# Patient Record
Sex: Female | Born: 1985
Health system: Southern US, Community
[De-identification: ages and names within clinical notes are randomized; demographics above are authoritative.]

## PROBLEM LIST (undated history)

## (undated) ENCOUNTER — Inpatient Hospital Stay (HOSPITAL_COMMUNITY): Payer: Self-pay

## (undated) DIAGNOSIS — R002 Palpitations: Secondary | ICD-10-CM

## (undated) DIAGNOSIS — R519 Headache, unspecified: Secondary | ICD-10-CM

## (undated) DIAGNOSIS — R42 Dizziness and giddiness: Secondary | ICD-10-CM

## (undated) DIAGNOSIS — T8859XA Other complications of anesthesia, initial encounter: Secondary | ICD-10-CM

## (undated) DIAGNOSIS — N809 Endometriosis, unspecified: Secondary | ICD-10-CM

## (undated) DIAGNOSIS — R51 Headache: Secondary | ICD-10-CM

## (undated) DIAGNOSIS — T4145XA Adverse effect of unspecified anesthetic, initial encounter: Secondary | ICD-10-CM

## (undated) DIAGNOSIS — D649 Anemia, unspecified: Secondary | ICD-10-CM

## (undated) HISTORY — PX: APPENDECTOMY: SHX54

## (undated) HISTORY — DX: Dizziness and giddiness: R42

---
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
## 2007-09-16 ENCOUNTER — Emergency Department (HOSPITAL_COMMUNITY): Admission: EM | Admit: 2007-09-16 | Discharge: 2007-09-16 | Payer: Self-pay | Admitting: Emergency Medicine

## 2011-08-30 ENCOUNTER — Emergency Department (HOSPITAL_COMMUNITY)
Admission: EM | Admit: 2011-08-30 | Discharge: 2011-08-30 | Disposition: A | Discharge status: Home / Self Care | Payer: 59 | Attending: Emergency Medicine | Admitting: Emergency Medicine

## 2011-08-30 ENCOUNTER — Encounter: Payer: Self-pay | Admitting: *Deleted

## 2011-08-30 DIAGNOSIS — K5289 Other specified noninfective gastroenteritis and colitis: Secondary | ICD-10-CM

## 2011-08-30 DIAGNOSIS — R112 Nausea with vomiting, unspecified: Secondary | ICD-10-CM | POA: Insufficient documentation

## 2011-08-30 DIAGNOSIS — R197 Diarrhea, unspecified: Secondary | ICD-10-CM | POA: Insufficient documentation

## 2011-08-30 LAB — COMPREHENSIVE METABOLIC PANEL
ALT: 12 U/L (ref 0–35)
AST: 15 U/L (ref 0–37)
Albumin: 4.5 g/dL (ref 3.5–5.2)
Alkaline Phosphatase: 53 U/L (ref 39–117)
BUN: 10 mg/dL (ref 6–23)
CO2: 26 mEq/L (ref 19–32)
Calcium: 9.4 mg/dL (ref 8.4–10.5)
Chloride: 102 mEq/L (ref 96–112)
Creatinine, Ser: <0.47 mg/dL — ABNORMAL LOW (ref 0.50–1.10)
Glucose, Bld: 87 mg/dL (ref 70–99)
Potassium: 3.4 mEq/L — ABNORMAL LOW (ref 3.5–5.1)
Sodium: 140 mEq/L (ref 135–145)
Total Bilirubin: 1 mg/dL (ref 0.3–1.2)
Total Protein: 7.8 g/dL (ref 6.0–8.3)

## 2011-08-30 LAB — CBC
MCH: 30.8 pg (ref 26.0–34.0)
MCHC: 33.4 g/dL (ref 30.0–36.0)
Platelets: 283 10*3/uL (ref 150–400)
RBC: 4.35 MIL/uL (ref 3.87–5.11)
RDW: 12.8 % (ref 11.5–15.5)

## 2011-08-30 LAB — URINALYSIS, ROUTINE W REFLEX MICROSCOPIC
Ketones, ur: 40 mg/dL — AB
Leukocytes, UA: NEGATIVE
Nitrite: NEGATIVE
Protein, ur: NEGATIVE mg/dL
pH: 6.5 (ref 5.0–8.0)

## 2011-08-30 LAB — URINE MICROSCOPIC-ADD ON

## 2011-08-30 LAB — LIPASE, BLOOD: Lipase: 31 U/L (ref 11–59)

## 2011-08-30 LAB — DIFFERENTIAL
Basophils Absolute: 0 10*3/uL (ref 0.0–0.1)
Basophils Relative: 0 % (ref 0–1)
Eosinophils Absolute: 0 10*3/uL (ref 0.0–0.7)
Neutrophils Relative %: 89 % — ABNORMAL HIGH (ref 43–77)

## 2011-08-30 MED ORDER — ONDANSETRON 8 MG PO TBDP: 8.0000 mg | ORAL_TABLET | Freq: Three times a day (TID) | ORAL | Status: AC | PRN

## 2011-08-30 MED ORDER — SODIUM CHLORIDE 0.9 % IV BOLUS (SEPSIS)
1000.0000 mL | Freq: Once | INTRAVENOUS | Status: AC
  Administered 2011-08-30: 1000 mL via INTRAVENOUS

## 2011-08-30 MED ORDER — ONDANSETRON HCL 4 MG/2ML IJ SOLN
4.0000 mg | Freq: Once | INTRAMUSCULAR | Status: DC
  Filled 2011-08-30: qty 2

## 2011-08-30 MED ORDER — ONDANSETRON HCL 4 MG/2ML IJ SOLN
4.0000 mg | Freq: Once | INTRAMUSCULAR | Status: AC
  Administered 2011-08-30: 4 mg via INTRAVENOUS
  Filled 2011-08-30: qty 2

## 2011-08-30 MED ORDER — METOCLOPRAMIDE HCL 5 MG/ML IJ SOLN
10.0000 mg | Freq: Once | INTRAMUSCULAR | Status: AC
  Administered 2011-08-30: 10 mg via INTRAVENOUS
  Filled 2011-08-30: qty 2

## 2011-08-30 NOTE — ED Provider Notes (Addendum)
History     CSN: [id number] Arrival date & time: 08/30/2011 11:03 AM   Chief Complaint  Patient presents with  . Abdominal Pain     (Include location/radiation/quality/duration/timing/severity/associated sxs/prior treatment) Patient is a 25 y.o. female presenting with vomiting. The history is provided by the patient and a relative.  Emesis  This is a new problem. The current episode started yesterday. The problem occurs 5 to 10 times per day. The problem has not changed since onset.The emesis has an appearance of stomach contents. There has been no fever. Associated symptoms include diarrhea. Pertinent negatives include no abdominal pain, no arthralgias, no chills, no fever and no headaches. Associated symptoms comments: Diarrhea x 2 early this am.  Abdominal cramping, improves with emesis or diarrhea.  . Risk factors include suspect food intake (she states symptoms started yesterday afternoon,  after eating an omelet for breakfast, which she has never had before.).     History reviewed. No pertinent past medical history.   Past Surgical History  Procedure Date  . Appendectomy     History reviewed. No pertinent family history.  History  Substance Use Topics  . Smoking status: Never Smoker   . Smokeless tobacco: Not on file  . Alcohol Use:     OB History    Grav Para Term Preterm Abortions TAB SAB Ect Mult Living                  Review of Systems  Constitutional: Negative for fever and chills.  HENT: Negative for congestion, sore throat and neck pain.   Eyes: Negative.   Respiratory: Negative for chest tightness and shortness of breath.   Cardiovascular: Negative for chest pain.  Gastrointestinal: Positive for vomiting and diarrhea. Negative for nausea, abdominal pain, constipation and abdominal distention.  Genitourinary: Negative.   Musculoskeletal: Negative for joint swelling and arthralgias.  Skin: Negative.  Negative for rash and wound.  Neurological: Negative  for dizziness, weakness, light-headedness, numbness and headaches.  Hematological: Negative.   Psychiatric/Behavioral: Negative.     Allergies  Ceclor and Phenergan  Home Medications   Current Outpatient Rx  Name Route Sig Dispense Refill  . BISMUTH SUBSALICYLATE 262 MG PO TABS Oral Take 2 tablets by mouth as needed.      Marland Kitchen MECLIZINE HCL 25 MG PO TABS Oral Take 25 mg by mouth daily as needed. For nausea        Physical Exam    BP 104/64  Pulse 70  Temp(Src) 98.9 F (37.2 C) (Oral)  Resp 19  Ht 5\' 7"  (1.702 m)  Wt 100 lb (45.36 kg)  BMI 15.66 kg/m2  SpO2 98%  Physical Exam  Nursing note and vitals reviewed. Constitutional: She is oriented to person, place, and time. She appears well-developed and well-nourished.  HENT:  Head: Normocephalic and atraumatic.  Eyes: Conjunctivae are normal.  Neck: Normal range of motion.  Cardiovascular: Normal rate, regular rhythm, normal heart sounds and intact distal pulses.   Pulmonary/Chest: Effort normal and breath sounds normal. She has no wheezes.  Abdominal: Soft. She exhibits no distension and no mass. Bowel sounds are increased. There is no tenderness. There is no rebound and no guarding.  Musculoskeletal: Normal range of motion.  Neurological: She is alert and oriented to person, place, and time.  Skin: Skin is warm and dry.  Psychiatric: She has a normal mood and affect.    ED Course  Procedures  Results for orders placed during the hospital encounter of 08/30/11  COMPREHENSIVE  METABOLIC PANEL      Component Value Range   Sodium 140  135 - 145 (mEq/L)   Potassium 3.4 (*) 3.5 - 5.1 (mEq/L)   Chloride 102  96 - 112 (mEq/L)   CO2 26  19 - 32 (mEq/L)   Glucose, Bld 87  70 - 99 (mg/dL)   BUN 10  6 - 23 (mg/dL)   Creatinine, Ser <1.30 (*) 0.50 - 1.10 (mg/dL)   Calcium 9.4  8.4 - 86.5 (mg/dL)   Total Protein 7.8  6.0 - 8.3 (g/dL)   Albumin 4.5  3.5 - 5.2 (g/dL)   AST 15  0 - 37 (U/L)   ALT 12  0 - 35 (U/L)   Alkaline  Phosphatase 53  39 - 117 (U/L)   Total Bilirubin 1.0  0.3 - 1.2 (mg/dL)   GFR calc non Af Amer NOT CALCULATED  >60 (mL/min)   GFR calc Af Amer NOT CALCULATED  >60 (mL/min)  LIPASE, BLOOD      Component Value Range   Lipase 31  11 - 59 (U/L)  URINALYSIS, ROUTINE W REFLEX MICROSCOPIC      Component Value Range   Color, Urine YELLOW  YELLOW    Appearance CLEAR  CLEAR    Specific Gravity, Urine 1.025  1.005 - 1.030    pH 6.5  5.0 - 8.0    Glucose, UA NEGATIVE  NEGATIVE (mg/dL)   Hgb urine dipstick TRACE (*) NEGATIVE    Bilirubin Urine NEGATIVE  NEGATIVE    Ketones, ur 40 (*) NEGATIVE (mg/dL)   Protein, ur NEGATIVE  NEGATIVE (mg/dL)   Urobilinogen, UA 0.2  0.0 - 1.0 (mg/dL)   Nitrite NEGATIVE  NEGATIVE    Leukocytes, UA NEGATIVE  NEGATIVE   PREGNANCY, URINE      Component Value Range   Preg Test, Ur NEGATIVE    CBC      Component Value Range   WBC 7.4  4.0 - 10.5 (K/uL)   RBC 4.35  3.87 - 5.11 (MIL/uL)   Hemoglobin 13.4  12.0 - 15.0 (g/dL)   HCT 78.4  69.6 - 29.5 (%)   MCV 92.2  78.0 - 100.0 (fL)   MCH 30.8  26.0 - 34.0 (pg)   MCHC 33.4  30.0 - 36.0 (g/dL)   RDW 28.4  13.2 - 44.0 (%)   Platelets 283  150 - 400 (K/uL)  DIFFERENTIAL      Component Value Range   Neutrophils Relative 89 (*) 43 - 77 (%)   Neutro Abs 6.6  1.7 - 7.7 (K/uL)   Lymphocytes Relative 6 (*) 12 - 46 (%)   Lymphs Abs 0.4 (*) 0.7 - 4.0 (K/uL)   Monocytes Relative 5  3 - 12 (%)   Monocytes Absolute 0.4  0.1 - 1.0 (K/uL)   Eosinophils Relative 0  0 - 5 (%)   Eosinophils Absolute 0.0  0.0 - 0.7 (K/uL)   Basophils Relative 0  0 - 1 (%)   Basophils Absolute 0.0  0.0 - 0.1 (K/uL)  URINE MICROSCOPIC-ADD ON      Component Value Range   Squamous Epithelial / LPF FEW (*) RARE    WBC, UA 0-2  <3 (WBC/hpf)   RBC / HPF 0-2  <3 (RBC/hpf)     Patient with continued nausea, although better after receiving zofran.  15:50 - reglan ordered.  Patient has received NS 2 liters.  Denies lightheadedness with standing for  orthostatic VS.     MDM Suspect viral  gastroenteritis vs food borne infection.   Zofran odt prescribed,  B.r.a.t. Diet,  Rest, fluids.         Candis Musa, PA 08/30/11 1556  Candis Musa, PA 08/30/11 1650

## 2011-08-30 NOTE — ED Provider Notes (Signed)
Medical screening examination/treatment/procedure(s) were performed by non-physician practitioner and as supervising physician I was immediately available for consultation/collaboration.  Nicholes Stairs, MD 08/30/11 [phone]

## 2011-08-30 NOTE — ED Notes (Signed)
Abdominal pain, nausea, vomiting and diarrhea

## 2011-08-30 NOTE — ED Notes (Signed)
Patient states that her heart rate is usually really fast. When going to the sitting position patient felt dizzy but after sitting there for a minute patient felt back to normal.

## 2011-08-30 NOTE — ED Notes (Signed)
Pt reports n/v abd pain since last night.  Pt reports "i feel like i ate something bad".  Pt reports that the pain is generalized.  Pt reports hx of appendectomy.

## 2011-08-30 NOTE — ED Notes (Signed)
Pt c/o severe nausea, gave Zofran per protocol.

## 2011-08-31 NOTE — ED Provider Notes (Signed)
Medical screening examination/treatment/procedure(s) were performed by non-physician practitioner and as supervising physician I was immediately available for consultation/collaboration.  Adama Ferber P Seaira Byus, MD 08/31/11 2043 

## 2011-12-02 ENCOUNTER — Ambulatory Visit (INDEPENDENT_AMBULATORY_CARE_PROVIDER_SITE_OTHER): Payer: 59 | Admitting: Gynecology

## 2011-12-02 ENCOUNTER — Encounter: Payer: Self-pay | Admitting: Gynecology

## 2011-12-02 ENCOUNTER — Other Ambulatory Visit (HOSPITAL_COMMUNITY)
Admission: RE | Admit: 2011-12-02 | Discharge: 2011-12-02 | Disposition: A | Discharge status: Home / Self Care | Payer: 59 | Source: Ambulatory Visit | Attending: Gynecology | Admitting: Gynecology

## 2011-12-02 VITALS — BP 96/62 | Ht 67.0 in | Wt 100.0 lb

## 2011-12-02 DIAGNOSIS — R519 Headache, unspecified: Secondary | ICD-10-CM | POA: Insufficient documentation

## 2011-12-02 DIAGNOSIS — R51 Headache: Secondary | ICD-10-CM | POA: Insufficient documentation

## 2011-12-02 DIAGNOSIS — Z13220 Encounter for screening for lipoid disorders: Secondary | ICD-10-CM

## 2011-12-02 DIAGNOSIS — Z01419 Encounter for gynecological examination (general) (routine) without abnormal findings: Secondary | ICD-10-CM | POA: Insufficient documentation

## 2011-12-02 DIAGNOSIS — Z131 Encounter for screening for diabetes mellitus: Secondary | ICD-10-CM

## 2011-12-02 MED ORDER — NORETHINDRONE ACET-ETHINYL EST 1-20 MG-MCG PO TABS: 1.0000 | ORAL_TABLET | Freq: Every day | ORAL | Status: DC

## 2011-12-02 NOTE — Progress Notes (Signed)
Maria Walton 04-08-1986 [id number]        25 y.o.  for first GYN exam.  Getting married this coming year. Menses are regular once a month little on the heavier side with cramping.  Past medical history,surgical history, medications, allergies, family history and social history were all reviewed and documented in the EPIC chart. ROS:  Was performed and pertinent positives and negatives are included in the history.  Exam: chaperone present Filed Vitals:   12/02/11 1002  BP: 96/62   General appearance  Normal Skin grossly normal Head/Neck normal with no cervical or supraclavicular adenopathy thyroid normal Lungs  clear Cardiac RR, without RMG Abdominal  soft, nontender, without masses, organomegaly or hernia Breasts  examined lying and sitting without masses, retractions, discharge or axillary adenopathy. Pelvic  Ext/BUS/vagina  normal virginal  Cervix  normal  Pap done  Uterus  retroverted, normal size, shape and contour, midline and mobile nontender   Adnexa  Without masses or tenderness    Anus and perineum  normal     Assessment/Plan:  25 y.o. female for first exam.    1. Contraception. Patient wants started on contraception in anticipation of her marriage. She is virginal now. I discussed options to include pill, patch, ring, Depo-Provera and Implanon, IUD. She wants to try oral contraceptives. I discussed the risks of stroke, heart attack, DVT. I prescribed Loestrin 120 equivalent. Every other month withdrawal option discussed, off brand labeling reviewed. 2. Pap smear. Pap smear was done today as her first Pap smear. 3. Breast health. SBE monthly reviewed. 4. Gardasil. I reviewed Gardasil series with her, gave her literature, recommended she consider the series and she'll decide. 5. Health maintenance. Will check baseline CBC, lipid profile, glucose, urinalysis. Assuming she does well with the pills, she will see me in a year, sooner as needed.    Dara Lords MD,  10:32 AM 12/02/2011

## 2011-12-02 NOTE — Patient Instructions (Addendum)
Start birth control pills as discussed. Assuming you do well then follow up in one year, sooner if any issues.  Consider Gardasil vaccine series as we discussed.

## 2012-02-21 ENCOUNTER — Telehealth: Payer: Self-pay | Admitting: *Deleted

## 2012-02-21 NOTE — Telephone Encounter (Signed)
Lets try one more 2 month cycle.  If it continues call and I will switch pills.

## 2012-02-21 NOTE — Telephone Encounter (Signed)
Pt informed with the below note. 

## 2012-02-21 NOTE — Telephone Encounter (Signed)
(  pt aware you are out of the office) Pt saw you back in December 2012 and started on microgestin birth control, pt didn't start pill until Jan and has been taking them as directed by you every other month withdrawal option. Pt has been having some dizzy spells, light-headed at times and also having a breakthrough bleeding for about 2 months now. Bleeding is not heavy, but not spotting either, mostly mild flow that would last for about 3-4 days. She said that bleeding is not as heavy as before prior to starting pills. Please advise

## 2012-05-22 ENCOUNTER — Emergency Department (HOSPITAL_COMMUNITY): Payer: 59

## 2012-05-22 ENCOUNTER — Encounter (HOSPITAL_COMMUNITY): Payer: Self-pay | Admitting: Emergency Medicine

## 2012-05-22 ENCOUNTER — Inpatient Hospital Stay (HOSPITAL_COMMUNITY)
Admission: EM | Admit: 2012-05-22 | Discharge: 2012-05-22 | DRG: 446 | Disposition: A | Discharge status: Home / Self Care | Payer: 59 | Attending: General Surgery | Admitting: General Surgery

## 2012-05-22 DIAGNOSIS — K8000 Calculus of gallbladder with acute cholecystitis without obstruction: Principal | ICD-10-CM | POA: Diagnosis present

## 2012-05-22 DIAGNOSIS — K8050 Calculus of bile duct without cholangitis or cholecystitis without obstruction: Secondary | ICD-10-CM

## 2012-05-22 LAB — DIFFERENTIAL
Basophils Absolute: 0 10*3/uL (ref 0.0–0.1)
Eosinophils Absolute: 0.1 10*3/uL (ref 0.0–0.7)
Eosinophils Relative: 1 % (ref 0–5)
Lymphs Abs: 1.8 10*3/uL (ref 0.7–4.0)
Monocytes Absolute: 0.5 10*3/uL (ref 0.1–1.0)

## 2012-05-22 LAB — CBC
MCH: 31.5 pg (ref 26.0–34.0)
MCV: 93.8 fL (ref 78.0–100.0)
Platelets: 273 10*3/uL (ref 150–400)
RDW: 12.9 % (ref 11.5–15.5)

## 2012-05-22 LAB — COMPREHENSIVE METABOLIC PANEL
ALT: 8 U/L (ref 0–35)
Calcium: 9.5 mg/dL (ref 8.4–10.5)
Creatinine, Ser: 0.74 mg/dL (ref 0.50–1.10)
GFR calc Af Amer: >90 mL/min (ref >90)
Glucose, Bld: 110 mg/dL — ABNORMAL HIGH (ref 70–99)
Sodium: 135 mEq/L (ref 135–145)
Total Protein: 8.1 g/dL (ref 6.0–8.3)

## 2012-05-22 LAB — PREGNANCY, URINE: Preg Test, Ur: NEGATIVE

## 2012-05-22 MED ORDER — SODIUM CHLORIDE 0.9 % IV SOLN: INTRAVENOUS | Status: DC

## 2012-05-22 MED ORDER — ONDANSETRON HCL 4 MG/2ML IJ SOLN: 4.0000 mg | Freq: Three times a day (TID) | INTRAMUSCULAR | Status: DC | PRN

## 2012-05-22 MED ORDER — HYDROMORPHONE HCL PF 1 MG/ML IJ SOLN: 1.0000 mg | INTRAMUSCULAR | Status: DC | PRN

## 2012-05-22 MED ORDER — HYDROMORPHONE HCL PF 1 MG/ML IJ SOLN
1.0000 mg | Freq: Once | INTRAMUSCULAR | Status: AC
  Administered 2012-05-22: 1 mg via INTRAVENOUS
  Filled 2012-05-22: qty 1

## 2012-05-22 MED ORDER — SODIUM CHLORIDE 0.9 % IV BOLUS (SEPSIS)
1000.0000 mL | Freq: Once | INTRAVENOUS | Status: AC
  Administered 2012-05-22: 1000 mL via INTRAVENOUS

## 2012-05-22 MED ORDER — ONDANSETRON HCL 4 MG/2ML IJ SOLN
4.0000 mg | Freq: Once | INTRAMUSCULAR | Status: AC
  Administered 2012-05-22: 4 mg via INTRAVENOUS
  Filled 2012-05-22: qty 2

## 2012-05-22 MED ORDER — IOHEXOL 300 MG/ML  SOLN
100.0000 mL | Freq: Once | INTRAMUSCULAR | Status: AC | PRN
  Administered 2012-05-22: 100 mL via INTRAVENOUS

## 2012-05-22 MED ORDER — FAMOTIDINE IN NACL 20-0.9 MG/50ML-% IV SOLN
20.0000 mg | Freq: Once | INTRAVENOUS | Status: AC
  Administered 2012-05-22: 20 mg via INTRAVENOUS
  Filled 2012-05-22: qty 50

## 2012-05-22 NOTE — ED Provider Notes (Addendum)
History     CSN: [id number]  Arrival date & time 05/22/12  3086   First MD Initiated Contact with Patient 05/22/12 0259      Chief Complaint  Patient presents with  . Abdominal Pain    (Consider location/radiation/quality/duration/timing/severity/associated sxs/prior treatment) Patient is a 26 y.o. female presenting with abdominal pain. The history is provided by the patient.  Abdominal Pain The primary symptoms of the illness include abdominal pain, nausea and vomiting. The primary symptoms of the illness do not include fever, shortness of breath, diarrhea, hematemesis or dysuria. The current episode started more than 2 days ago (onset discomfort about a week ago but got worse tonight at around midnight). The onset of the illness was sudden. The problem has been rapidly worsening.  The abdominal pain is located in the epigastric region (but worse in the upper quadrants.). The abdominal pain radiates to the back. The severity of the abdominal pain is 10/10. The abdominal pain is relieved by nothing.  Additional symptoms associated with the illness include back pain. Symptoms associated with the illness do not include diaphoresis.   Patient with similar pain in the past but would usually resolve her mother thought that maybe she had a gallstone problem. No family history of gallstones.  Past Medical History  Diagnosis Date  . Headache     Past Surgical History  Procedure Date  . Appendectomy     Family History  Problem Relation Age of Onset  . Hypertension Father   . Diabetes Maternal Grandmother   . Heart disease Maternal Grandmother   . Hypertension Paternal Grandfather     History  Substance Use Topics  . Smoking status: Never Smoker   . Smokeless tobacco: Never Used  . Alcohol Use: No    OB History    Grav Para Term Preterm Abortions TAB SAB Ect Mult Living   0               Review of Systems  Constitutional: Negative for fever and diaphoresis.  HENT:  Negative for neck pain.   Eyes: Negative for visual disturbance.  Respiratory: Negative for shortness of breath.   Cardiovascular: Negative for chest pain.  Gastrointestinal: Positive for nausea, vomiting and abdominal pain. Negative for diarrhea and hematemesis.  Genitourinary: Negative for dysuria.  Musculoskeletal: Positive for back pain.  Skin: Negative for rash.  Neurological: Negative for headaches.  Hematological: Does not bruise/bleed easily.    Allergies  Ceclor and Promethazine hcl  Home Medications   Current Outpatient Rx  Name Route Sig Dispense Refill  . ACETAMINOPHEN 325 MG PO TABS Oral Take 650 mg by mouth every 6 (six) hours as needed.      Marland Kitchen BISMUTH SUBSALICYLATE 262 MG PO TABS Oral Take 2 tablets by mouth as needed.      . IBUPROFEN 200 MG PO TABS Oral Take 200 mg by mouth every 6 (six) hours as needed. Cramps      . MECLIZINE HCL 25 MG PO TABS Oral Take 25 mg by mouth daily as needed. For nausea      . NORETHINDRONE ACET-ETHINYL EST 1-20 MG-MCG PO TABS Oral Take 1 tablet by mouth daily. 1 Package 11    BP 106/66  Pulse 84  Temp(Src) 97.5 F (36.4 C) (Oral)  Resp 20  Ht 5\' 7"  (1.702 m)  Wt 100 lb (45.36 kg)  BMI 15.66 kg/m2  SpO2 99%  LMP 04/29/2012  Physical Exam  Nursing note and vitals reviewed. Constitutional: She is oriented  to person, place, and time. She appears well-developed and well-nourished. She appears distressed.  HENT:  Head: Normocephalic and atraumatic.  Mouth/Throat: Oropharynx is clear and moist.  Eyes: Conjunctivae and EOM are normal. Pupils are equal, round, and reactive to light.  Neck: Normal range of motion. Neck supple.  Cardiovascular: Normal rate, regular rhythm and normal heart sounds.   No murmur heard. Pulmonary/Chest: Effort normal and breath sounds normal. She has no wheezes.  Abdominal: Soft. Bowel sounds are normal. There is tenderness. There is no guarding.       Tenderness predominantly epigastric right upper  quadrant area. No guarding.  Musculoskeletal: Normal range of motion. She exhibits no edema.  Neurological: She is alert and oriented to person, place, and time. No cranial nerve deficit. She exhibits normal muscle tone. Coordination normal.  Skin: Skin is warm. No rash noted. She is not diaphoretic.    ED Course  Procedures (including critical care time)  Labs Reviewed  COMPREHENSIVE METABOLIC PANEL - Abnormal; Notable for the following:    Potassium 3.1 (*)    Glucose, Bld 110 (*)    All other components within normal limits  CBC  DIFFERENTIAL  LIPASE, BLOOD  PREGNANCY, URINE   Dg Chest 2 View  05/22/2012  *RADIOLOGY REPORT*  Clinical Data: Upper abdominal pain  CHEST - 2 VIEW  Comparison: None.  Findings: Lungs are clear.  Cardiomediastinal contours within normal limits.  Mild rightward curvature of the thoracic spine.  No acute osseous finding.  IMPRESSION: No radiographic evidence of acute cardiopulmonary process.  Original Report Authenticated By: Waneta Martins, M.D.   Ct Abdomen Pelvis W Contrast  05/22/2012  *RADIOLOGY REPORT*  Clinical Data: Abdominal pain  CT ABDOMEN AND PELVIS WITH CONTRAST  Technique:  Multidetector CT imaging of the abdomen and pelvis was performed following the standard protocol during bolus administration of intravenous contrast.  Contrast: OMNIPAQUE IOHEXOL 300 MG/ML  SOLN  Comparison: None.  Findings: Limited images through the lung bases demonstrate no significant appreciable abnormality. The heart size is within normal limits. No pleural or pericardial effusion.  Unremarkable liver, spleen, pancreas, adrenal glands.  The gallbladder is distended and contains a large gallstone.  There is mild gallbladder wall thickening.  No biliary ductal dilatation. Mild periportal edema.  Symmetric renal enhancement.  No hydronephrosis or hydroureter.  No bowel obstruction.  No CT evidence for colitis.  Appendix not identified.  No free intraperitoneal air.   Circumaortic left renal vein.  Normal caliber aorta and branch vessels.  Thin-walled bladder.  Small amount of free fluid within the pelvis. Right adnexal cyst is likely physiologic.  No acute osseous finding.  IMPRESSION:  Distended gallbladder with an intraluminal gallstone and gallbladder wall thickening.   May represent acute cholecystitis. Consider ultrasound or HIDA scan for confirmation.  Right adnexal cyst and small amount of free fluid within the pelvis is likely physiologic.  If there is concern for pelvic pathology, pelvic examination and ultrasound recommended.  The appendix is not identified and per report is surgically absent.  Original Report Authenticated By: Waneta Martins, M.D.   Results for orders placed during the hospital encounter of 05/22/12  CBC      Component Value Range   WBC 9.7  4.0 - 10.5 (K/uL)   RBC 4.38  3.87 - 5.11 (MIL/uL)   Hemoglobin 13.8  12.0 - 15.0 (g/dL)   HCT 16.1  09.6 - 04.5 (%)   MCV 93.8  78.0 - 100.0 (fL)   MCH  31.5  26.0 - 34.0 (pg)   MCHC 33.6  30.0 - 36.0 (g/dL)   RDW 47.8  29.5 - 62.1 (%)   Platelets 273  150 - 400 (K/uL)  DIFFERENTIAL      Component Value Range   Neutrophils Relative 76  43 - 77 (%)   Neutro Abs 7.4  1.7 - 7.7 (K/uL)   Lymphocytes Relative 19  12 - 46 (%)   Lymphs Abs 1.8  0.7 - 4.0 (K/uL)   Monocytes Relative 5  3 - 12 (%)   Monocytes Absolute 0.5  0.1 - 1.0 (K/uL)   Eosinophils Relative 1  0 - 5 (%)   Eosinophils Absolute 0.1  0.0 - 0.7 (K/uL)   Basophils Relative 0  0 - 1 (%)   Basophils Absolute 0.0  0.0 - 0.1 (K/uL)  COMPREHENSIVE METABOLIC PANEL      Component Value Range   Sodium 135  135 - 145 (mEq/L)   Potassium 3.1 (*) 3.5 - 5.1 (mEq/L)   Chloride 99  96 - 112 (mEq/L)   CO2 26  19 - 32 (mEq/L)   Glucose, Bld 110 (*) 70 - 99 (mg/dL)   BUN 9  6 - 23 (mg/dL)   Creatinine, Ser 3.08  0.50 - 1.10 (mg/dL)   Calcium 9.5  8.4 - 65.7 (mg/dL)   Total Protein 8.1  6.0 - 8.3 (g/dL)   Albumin 4.2  3.5 - 5.2  (g/dL)   AST 14  0 - 37 (U/L)   ALT 8  0 - 35 (U/L)   Alkaline Phosphatase 46  39 - 117 (U/L)   Total Bilirubin 0.6  0.3 - 1.2 (mg/dL)   GFR calc non Af Amer >90  >90 (mL/min)   GFR calc Af Amer >90  >90 (mL/min)  LIPASE, BLOOD      Component Value Range   Lipase 36  11 - 59 (U/L)  PREGNANCY, URINE      Component Value Range   Preg Test, Ur NEGATIVE  NEGATIVE      1. Biliary colic       MDM   Patient with some abdominal pain all week got worse tonight CT is consistent with cholelithiasis symptoms most likely prolonged biliary colic currently pain-free and nontender no leukocytosis no liver function test abnormalities lipase is normal no evidence of pancreatitis. Please see test is negative. Patient would prefer to be admitted and had her gallbladder removed sooner than later discussed with general surgery on call who will admit temporary middle orders. CT results is consistent with [place] in gallbladder distention and some gallbladder wall thickening however clinically and based on labs no evidence of significant acute cholecystitis at this time.  DW General Surgery Dr. Leticia Penna option given to patient to go home and close follow up for gallbladder removal or to be admitted now and have gallbladder removed later today or tomorrow. Patient opted for removal now. Patient most likely with prolonged biliary colic for past week and some signs of early cholecystitis but currently nontender and pain free.   Temporary admit orders completed to MedSurg.        Shelda Jakes, MD 05/22/12 8469  Shelda Jakes, MD 05/23/12 0400

## 2012-05-22 NOTE — ED Notes (Signed)
Received report, pt resting at present time, update given

## 2012-05-22 NOTE — Plan of Care (Signed)
Problem: Phase I Progression Outcomes Goal: Initial discharge plan identified Outcome: Completed/Met Date Met:  05/22/12 Pt d/c home verbalized understanding of d/c orders.

## 2012-05-22 NOTE — ED Notes (Signed)
Pt reporting cramping abdominal pain in upper quadrants.  Reports intermittent for past few days, however sharp and severe enough tonight to wake her.  Denies any nausea, vomiting or diarrhea.

## 2012-05-22 NOTE — ED Notes (Signed)
Pt comfortable at present time, family at bedside,

## 2012-05-22 NOTE — ED Notes (Signed)
Patient complaining of abdominal pain radiating into chest area x 1 week worsening tonight. Denies vomiting / diarrhea.

## 2012-05-22 NOTE — Discharge Summary (Signed)
Patient presented this morning to the emergency department with right upper quadrant abdominal pain. Apparently the pain has been going on for several weeks per the report by the emergency room physician. During her evaluation workup in the emergency department her symptomatology actually improved. Her laboratory evaluation was within normal limits. At that time I had a discussion with the emergency room physician regarding patient's desires and wishes to proceed versus possible elective outpatient surgery. At the time the patient and family discussed with the emergency room physician wishing to be admitted for proceeding with a cholecystectomy today. Orders were placed initially admit the patient from the emergency room department with plans to evaluate the patient following my elective surgical case this morning.  During my case with the floor nurse contacted me regarding the patient's and patient's family request for another surgeon to perform her operation. Unfortunately her request and surgeon is on vacation this week. As the patient is not acutely ill in her symptoms had resolved this morning in the emergency department, I feel it is reasonable to allow the patient to go home and followup as an outpatient with her requested surgeon.  Patient was advised to return to the emergency department should her symptomatology return and or worsen.   Date of admission:  05/22/12 Date of discharge: 05/22/12  Diagnoses: Cholelithiasis, with acute non-complicated cholecystitis.  Disposition: Home  Followup:  Patient to contact surgeon and arrange outpatient evaluation and followup.

## 2012-05-24 ENCOUNTER — Encounter (HOSPITAL_COMMUNITY): Payer: Self-pay | Admitting: Pharmacy Technician

## 2012-05-29 ENCOUNTER — Other Ambulatory Visit (HOSPITAL_COMMUNITY): Payer: Self-pay | Admitting: General Surgery

## 2012-05-29 ENCOUNTER — Encounter (HOSPITAL_COMMUNITY): Payer: Self-pay

## 2012-05-29 ENCOUNTER — Encounter (HOSPITAL_COMMUNITY)
Admit: 2012-05-29 | Discharge: 2012-05-29 | Disposition: A | Discharge status: Home / Self Care | Payer: 59 | Attending: General Surgery | Admitting: General Surgery

## 2012-05-29 DIAGNOSIS — R1011 Right upper quadrant pain: Secondary | ICD-10-CM

## 2012-05-29 HISTORY — DX: Palpitations: R00.2

## 2012-05-29 LAB — SURGICAL PCR SCREEN
MRSA, PCR: NEGATIVE
Staphylococcus aureus: NEGATIVE

## 2012-05-29 LAB — BASIC METABOLIC PANEL
CO2: 27 mEq/L (ref 19–32)
GFR calc non Af Amer: >90 mL/min (ref >90)
Glucose, Bld: 105 mg/dL — ABNORMAL HIGH (ref 70–99)
Potassium: 3.7 mEq/L (ref 3.5–5.1)
Sodium: 138 mEq/L (ref 135–145)

## 2012-05-29 MED ORDER — ENOXAPARIN SODIUM 40 MG/0.4ML ~~LOC~~ SOLN: 40.0000 mg | Freq: Once | SUBCUTANEOUS | Status: DC

## 2012-05-29 MED ORDER — CIPROFLOXACIN IN D5W 400 MG/200ML IV SOLN: 400.0000 mg | INTRAVENOUS | Status: DC

## 2012-05-29 NOTE — Patient Instructions (Addendum)
21 Maria Walton  05/29/2012   Your procedure is scheduled on: 06/05/12  Report to [facility] at 7:25 AM.  Call this number if you have problems the morning of surgery: [phone]   Remember:   Do not eat food:After Midnight.  May have  liquids:until Midnight .    Take these medicines the morning of surgery with A SIP OF WATER: birth control pill, tylenol   Do not wear jewelry, make-up or nail polish.  Do not wear lotions, powders, or perfumes. You may wear deodorant.  Do not shave 48 hours prior to surgery. Men may shave face and neck.  Do not bring valuables to the hospital.  Contacts, dentures or bridgework may not be worn into surgery.  Leave suitcase in the car. After surgery it may be brought to your room.  For patients admitted to the hospital, checkout time is 11:00 AM the day of discharge.   Patients discharged the day of surgery will not be allowed to drive home.  Name and phone number of your driver: family  Special Instructions: CHG Shower Use Special Wash: 1/2 bottle night before surgery and 1/2 bottle morning of surgery.   Please read over the following fact sheets that you were given: Pain Booklet, Coughing and Deep Breathing, Blood Transfusion Information, MRSA Information, Surgical Site Infection Prevention, Anesthesia Post-op Instructions and Care and Recovery After Surgery  Laparoscopic Cholecystectomy Laparoscopic cholecystectomy is surgery to remove the gallbladder. The gallbladder is located slightly to the right of center in the abdomen, behind the liver. It is a concentrating and storage sac for the bile produced in the liver. Bile aids in the digestion and absorption of fats. Gallbladder disease (cholecystitis) is an inflammation of your gallbladder. This condition is usually caused by a buildup of gallstones (cholelithiasis) in your gallbladder. Gallstones can block the flow of bile, resulting in inflammation and pain. In severe cases, emergency surgery may be  required. When emergency surgery is not required, you will have time to prepare for the procedure. Laparoscopic surgery is an alternative to open surgery. Laparoscopic surgery usually has a shorter recovery time. Your common bile duct may also need to be examined and explored. Your caregiver will discuss this with you if he or she feels this should be done. If stones are found in the common bile duct, they may be removed. LET YOUR CAREGIVER KNOW ABOUT:  Allergies to food or medicine.   Medicines taken, including vitamins, herbs, eyedrops, over-the-counter medicines, and creams.   Use of steroids (by mouth or creams).   Previous problems with anesthetics or numbing medicines.   History of bleeding problems or blood clots.   Previous surgery.   Other health problems, including diabetes and kidney problems.   Possibility of pregnancy, if this applies.  RISKS AND COMPLICATIONS All surgery is associated with risks. Some problems that may occur following this procedure include:  Infection.   Damage to the common bile duct, nerves, arteries, veins, or other internal organs such as the stomach or intestines.   Bleeding.   A stone may remain in the common bile duct.  BEFORE THE PROCEDURE  Do not take aspirin for 3 days prior to surgery or blood thinners for 1 week prior to surgery.   Do not eat or drink anything after midnight the night before surgery.   Let your caregiver know if you develop a cold or other infectious problem prior to surgery.   You should be present 60 minutes before the procedure or  as directed.  PROCEDURE  You will be given medicine that makes you sleep (general anesthetic). When you are asleep, your surgeon will make several small cuts (incisions) in your abdomen. One of these incisions is used to insert a small, lighted scope (laparoscope) into the abdomen. The laparoscope helps the surgeon see into your abdomen. Carbon dioxide gas will be pumped into your  abdomen. The gas allows more room for the surgeon to perform your surgery. Other operating instruments are inserted through the other incisions. Laparoscopic procedures may not be appropriate when:  There is major scarring from previous surgery.   The gallbladder is extremely inflamed.   There are bleeding disorders or unexpected cirrhosis of the liver.   A pregnancy is near term.   Other conditions make the laparoscopic procedure impossible.  If your surgeon feels it is not safe to continue with a laparoscopic procedure, he or she will perform an open abdominal procedure. In this case, the surgeon will make an incision to open the abdomen. This gives the surgeon a larger view and field to work within. This may allow the surgeon to perform procedures that sometimes cannot be performed with a laparoscope alone. Open surgery has a longer recovery time. AFTER THE PROCEDURE  You will be taken to the recovery area where a nurse will watch and check your progress.   You may be allowed to go home the same day.   Do not resume physical activities until directed by your caregiver.   You may resume a normal diet and activities as directed.  Document Released: 11/29/2005 Document Revised: 11/18/2011 Document Reviewed: 05/14/2011 [facility] Patient Information 2012 [place], [place].   PATIENT INSTRUCTIONS POST-ANESTHESIA  IMMEDIATELY FOLLOWING SURGERY:  Do not drive or operate machinery for the first twenty four hours after surgery.  Do not make any important decisions for twenty four hours after surgery or while taking narcotic pain medications or sedatives.  If you develop intractable nausea and vomiting or a severe headache please notify your doctor immediately.  FOLLOW-UP:  Please make an appointment with your surgeon as instructed. You do not need to follow up with anesthesia unless specifically instructed to do so.  WOUND CARE INSTRUCTIONS (if applicable):  Keep a dry clean dressing on the  anesthesia/puncture wound site if there is drainage.  Once the wound has quit draining you may leave it open to air.  Generally you should leave the bandage intact for twenty four hours unless there is drainage.  If the epidural site drains for more than 36-48 hours please call the anesthesia department.  QUESTIONS?:  Please feel free to call your physician or the hospital operator if you have any questions, and they will be happy to assist you.

## 2012-05-30 NOTE — Consult Note (Signed)
NAMESUEZETTE, LAFAVE NO.:  [id number]  MEDICAL RECORD NO.:  [id number]  LOCATION:  DOIB                          FACILITY:  [facility]  PHYSICIAN:  Barbaraann Barthel, M.D. 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  She was recently seen last week in the emergency room for this and she was worked up and found on CT scan to have a cholelithiasis.  Sonogram was not done at that time, this is pending.  Her liver function studies were grossly within normal limits.  She was placed on a restrictive diet and followed up with me in the office, at which time, I examined her and order a sonogram and her right upper quadrant pain had significantly diminished.  We planned for outpatient laparoscopic cholecystectomy.  We discussed its complications, but not limited to including bleeding, infection, damage to bile ducts, perforation of organs, transitory diarrhea, and the possibility of open cholecystectomy might be required. Informed consent was obtained.  MEDICATIONS:  She is not on any current medications other than Tylenol and Motrin, and Microgestin 1/20.  ALLERGIES:  She is allergic to CECLOR in the past and PHENERGAN.  SOCIAL HISTORY:  She does not drink or smoke.  PHYSICAL EXAMINATION:  VITAL SIGNS:  The patient is very thin. She is 5 feet and 7 inches, weighs 105 pounds.  Temperature is 98.0, pulse is 88, respirations are 12 per minute and regular, blood pressure 110/60. HEENT:  Head is normocephalic.  Eyes; extraocular movements are intact. Pupils are round and reactive to light and accommodation.  There is no conjunctive pallor or scleral injection.  The sclera is a normal tincture.  Nose and oral mucosa are moist. NECK:  Supple and  cylindrical without jugular vein distention, thyromegaly or tracheal deviation.  There are no bruits auscultated and no adenopathy. CHEST:  Clear both the anterior and posterior auscultation. HEART:  Regular rhythm.  Breasts and axilla are without masses. ABDOMEN:  Soft.  There is minimal guarding, no rebound and there are no masses or hernias. RECTAL:  Deferred. PELVIC:  Deferred. EXTREMITIES:  Within normal limits.  OB/GYN HISTORY:  She is a gravida 0, para 0, cesarean, 0 abortus 0 female with no past history of carcinoma of the breast.  Last menstrual period is Apr 29, 2012.  REVIEW OF SYSTEMS:  NEURO SYSTEM:  No history of migraines or seizures. ENDOCRINE SYSTEM:  No history of diabetes or thyroid disease. CARDIOPULMONARY SYSTEM:  Grossly within normal limits.  MUSCULOSKELETAL SYSTEM:  The patient is very slight, but well proportion.  GI SYSTEM: No history of hepatitis, constipation, bright red rectal bleeding, diarrhea, unexplained weight loss or history of inflammatory bowel disease.  The patient has never had a colonoscopy.  GU SYSTEM:  No history of kidney stones, dysuria or frequency.  LABORATORY DATA:  The patient as mentioned her sonogram is pending.  Her white count was 9.7 with an H and H of  13.8 and 41.4 with fairly normal differential.  Her electrolytes were grossly within normal limits.  As mentioned, her liver function studies are not elevated.  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  She is still on a restrictive diet.  She was told to contact us should there be any acute changes.  We will plan for surgery at her convenience next week.     Barbaraann Barthel, M.D.     WB/MEDQ  D:  05/30/2012  T:  05/30/2012  Job:  [id number]  cc:   Shelda Jakes, MD [address] [place], [place] [address]

## 2012-05-31 ENCOUNTER — Other Ambulatory Visit (HOSPITAL_COMMUNITY): Payer: Self-pay | Admitting: General Surgery

## 2012-05-31 ENCOUNTER — Ambulatory Visit (HOSPITAL_COMMUNITY)
Admission: RE | Admit: 2012-05-31 | Discharge: 2012-05-31 | Disposition: A | Discharge status: Home / Self Care | Payer: 59 | Source: Ambulatory Visit | Attending: General Surgery | Admitting: General Surgery

## 2012-05-31 DIAGNOSIS — R1011 Right upper quadrant pain: Secondary | ICD-10-CM

## 2012-05-31 DIAGNOSIS — K8020 Calculus of gallbladder without cholecystitis without obstruction: Secondary | ICD-10-CM | POA: Insufficient documentation

## 2012-06-05 ENCOUNTER — Encounter (HOSPITAL_COMMUNITY): Payer: Self-pay | Admitting: Anesthesiology

## 2012-06-05 ENCOUNTER — Encounter (HOSPITAL_COMMUNITY): Payer: Self-pay | Admitting: *Deleted

## 2012-06-05 ENCOUNTER — Ambulatory Visit (HOSPITAL_COMMUNITY)
Admission: RE | Admit: 2012-06-05 | Discharge: 2012-06-06 | Disposition: A | Discharge status: Home / Self Care | Payer: 59 | Source: Ambulatory Visit | Attending: General Surgery | Admitting: General Surgery

## 2012-06-05 ENCOUNTER — Ambulatory Visit (HOSPITAL_COMMUNITY): Payer: 59 | Admitting: Anesthesiology

## 2012-06-05 ENCOUNTER — Encounter (HOSPITAL_COMMUNITY)
Admission: RE | Disposition: A | Discharge status: Home / Self Care | Payer: Self-pay | Source: Ambulatory Visit | Attending: General Surgery

## 2012-06-05 DIAGNOSIS — K8010 Calculus of gallbladder with chronic cholecystitis without obstruction: Secondary | ICD-10-CM | POA: Insufficient documentation

## 2012-06-05 DIAGNOSIS — R51 Headache: Secondary | ICD-10-CM

## 2012-06-05 HISTORY — PX: CHOLECYSTECTOMY: SHX55

## 2012-06-05 SURGERY — LAPAROSCOPIC CHOLECYSTECTOMY
Anesthesia: General | Site: Abdomen | Wound class: Clean Contaminated

## 2012-06-05 MED ORDER — DEXAMETHASONE SODIUM PHOSPHATE 4 MG/ML IJ SOLN
4.0000 mg | Freq: Once | INTRAMUSCULAR | Status: AC
  Administered 2012-06-05: 4 mg via INTRAVENOUS

## 2012-06-05 MED ORDER — ONDANSETRON HCL 4 MG/2ML IJ SOLN
4.0000 mg | Freq: Once | INTRAMUSCULAR | Status: AC
  Administered 2012-06-05: 4 mg via INTRAVENOUS

## 2012-06-05 MED ORDER — LIDOCAINE HCL 1 % IJ SOLN
INTRAMUSCULAR | Status: DC | PRN
  Administered 2012-06-05: 30 mg via INTRADERMAL

## 2012-06-05 MED ORDER — FENTANYL CITRATE 0.05 MG/ML IJ SOLN
INTRAMUSCULAR | Status: DC | PRN
  Administered 2012-06-05: 25 ug via INTRAVENOUS
  Administered 2012-06-05: 50 ug via INTRAVENOUS
  Administered 2012-06-05: 25 ug via INTRAVENOUS
  Administered 2012-06-05 (×2): 50 ug via INTRAVENOUS

## 2012-06-05 MED ORDER — FENTANYL CITRATE 0.05 MG/ML IJ SOLN
INTRAMUSCULAR | Status: AC
  Filled 2012-06-05: qty 5

## 2012-06-05 MED ORDER — BUPIVACAINE HCL (PF) 0.5 % IJ SOLN
INTRAMUSCULAR | Status: AC
  Filled 2012-06-05: qty 30

## 2012-06-05 MED ORDER — MIDAZOLAM HCL 2 MG/2ML IJ SOLN
INTRAMUSCULAR | Status: AC
  Administered 2012-06-05: 2 mg via INTRAVENOUS
  Filled 2012-06-05: qty 2

## 2012-06-05 MED ORDER — GLYCOPYRROLATE 0.2 MG/ML IJ SOLN
INTRAMUSCULAR | Status: AC
  Filled 2012-06-05: qty 2

## 2012-06-05 MED ORDER — SCOPOLAMINE 1 MG/3DAYS TD PT72
1.0000 | MEDICATED_PATCH | Freq: Once | TRANSDERMAL | Status: DC
  Administered 2012-06-05: 1.5 mg via TRANSDERMAL

## 2012-06-05 MED ORDER — PROPOFOL 10 MG/ML IV BOLUS
INTRAVENOUS | Status: DC | PRN
  Administered 2012-06-05: 120 mg via INTRAVENOUS

## 2012-06-05 MED ORDER — ONDANSETRON HCL 4 MG PO TABS: 4.0000 mg | ORAL_TABLET | Freq: Four times a day (QID) | ORAL | Status: DC | PRN

## 2012-06-05 MED ORDER — ROCURONIUM BROMIDE 100 MG/10ML IV SOLN
INTRAVENOUS | Status: DC | PRN
  Administered 2012-06-05: 30 mg via INTRAVENOUS
  Administered 2012-06-05 (×2): 5 mg via INTRAVENOUS

## 2012-06-05 MED ORDER — MORPHINE SULFATE 2 MG/ML IJ SOLN: 1.0000 mg | INTRAMUSCULAR | Status: DC | PRN

## 2012-06-05 MED ORDER — POTASSIUM CHLORIDE IN NACL 20-0.9 MEQ/L-% IV SOLN
INTRAVENOUS | Status: DC
  Administered 2012-06-05 – 2012-06-06 (×3): via INTRAVENOUS

## 2012-06-05 MED ORDER — GLYCOPYRROLATE 0.2 MG/ML IJ SOLN
INTRAMUSCULAR | Status: DC | PRN
  Administered 2012-06-05: 0.4 mg via INTRAVENOUS

## 2012-06-05 MED ORDER — ONDANSETRON HCL 4 MG/2ML IJ SOLN
INTRAMUSCULAR | Status: AC
  Administered 2012-06-05: 4 mg via INTRAVENOUS
  Filled 2012-06-05: qty 2

## 2012-06-05 MED ORDER — ACETAMINOPHEN 325 MG PO TABS
650.0000 mg | ORAL_TABLET | ORAL | Status: DC | PRN
  Administered 2012-06-05 – 2012-06-06 (×3): 650 mg via ORAL
  Filled 2012-06-05 (×3): qty 2

## 2012-06-05 MED ORDER — FENTANYL CITRATE 0.05 MG/ML IJ SOLN
25.0000 ug | INTRAMUSCULAR | Status: DC | PRN
  Administered 2012-06-05 (×2): 50 ug via INTRAVENOUS

## 2012-06-05 MED ORDER — SODIUM CHLORIDE 0.9 % IR SOLN
Status: DC | PRN
  Administered 2012-06-05: 1000 mL

## 2012-06-05 MED ORDER — PROPOFOL 10 MG/ML IV EMUL
INTRAVENOUS | Status: AC
  Filled 2012-06-05: qty 20

## 2012-06-05 MED ORDER — HEMOSTATIC AGENTS (NO CHARGE) OPTIME
TOPICAL | Status: DC | PRN
  Administered 2012-06-05: 1 via TOPICAL

## 2012-06-05 MED ORDER — NEOSTIGMINE METHYLSULFATE 1 MG/ML IJ SOLN
INTRAMUSCULAR | Status: DC | PRN
  Administered 2012-06-05: 2 mg via INTRAVENOUS

## 2012-06-05 MED ORDER — BUPIVACAINE HCL (PF) 0.5 % IJ SOLN
INTRAMUSCULAR | Status: DC | PRN
  Administered 2012-06-05: 14 mL

## 2012-06-05 MED ORDER — MIDAZOLAM HCL 2 MG/2ML IJ SOLN
1.0000 mg | INTRAMUSCULAR | Status: DC | PRN
  Administered 2012-06-05: 2 mg via INTRAVENOUS

## 2012-06-05 MED ORDER — DEXAMETHASONE SODIUM PHOSPHATE 4 MG/ML IJ SOLN
INTRAMUSCULAR | Status: AC
  Administered 2012-06-05: 4 mg via INTRAVENOUS
  Filled 2012-06-05: qty 1

## 2012-06-05 MED ORDER — CIPROFLOXACIN IN D5W 400 MG/200ML IV SOLN
INTRAVENOUS | Status: AC
  Filled 2012-06-05: qty 200

## 2012-06-05 MED ORDER — ONDANSETRON HCL 4 MG/2ML IJ SOLN: 4.0000 mg | Freq: Four times a day (QID) | INTRAMUSCULAR | Status: DC | PRN

## 2012-06-05 MED ORDER — ROCURONIUM BROMIDE 50 MG/5ML IV SOLN
INTRAVENOUS | Status: AC
  Filled 2012-06-05: qty 1

## 2012-06-05 MED ORDER — LIDOCAINE HCL (PF) 1 % IJ SOLN
INTRAMUSCULAR | Status: AC
  Filled 2012-06-05: qty 5

## 2012-06-05 MED ORDER — SCOPOLAMINE 1 MG/3DAYS TD PT72
MEDICATED_PATCH | TRANSDERMAL | Status: AC
  Administered 2012-06-05: 1.5 mg via TRANSDERMAL
  Filled 2012-06-05: qty 1

## 2012-06-05 MED ORDER — CIPROFLOXACIN IN D5W 400 MG/200ML IV SOLN
400.0000 mg | INTRAVENOUS | Status: AC
  Administered 2012-06-05: 400 mg via INTRAVENOUS

## 2012-06-05 MED ORDER — ONDANSETRON HCL 4 MG/2ML IJ SOLN: 4.0000 mg | Freq: Once | INTRAMUSCULAR | Status: DC | PRN

## 2012-06-05 MED ORDER — WATER FOR IRRIGATION, STERILE IR SOLN
Status: DC | PRN
  Administered 2012-06-05: 2000 mL

## 2012-06-05 MED ORDER — LACTATED RINGERS IV SOLN
INTRAVENOUS | Status: DC
  Administered 2012-06-05: 1000 mL via INTRAVENOUS

## 2012-06-05 MED ORDER — FENTANYL CITRATE 0.05 MG/ML IJ SOLN
INTRAMUSCULAR | Status: AC
  Administered 2012-06-05: 50 ug via INTRAVENOUS
  Filled 2012-06-05: qty 2

## 2012-06-05 SURGICAL SUPPLY — 63 items
APPLICATOR COTTON TIP 6IN STRL (MISCELLANEOUS) IMPLANT
APPLIER CLIP ROT 10 11.4 M/L (STAPLE) ×2
ATTRACTOMAT 16X20 MAGNETIC DRP (DRAPES) IMPLANT
BAG HAMPER (MISCELLANEOUS) ×2 IMPLANT
BLADE SURG 15 STRL LF DISP TIS (BLADE) ×1 IMPLANT
BLADE SURG 15 STRL SS (BLADE) ×1
BLADE SURG SZ10 CARB STEEL (BLADE) IMPLANT
CLIP APPLIE ROT 10 11.4 M/L (STAPLE) ×1 IMPLANT
CLOTH BEACON ORANGE TIMEOUT ST (SAFETY) ×2 IMPLANT
COVER LIGHT HANDLE STERIS (MISCELLANEOUS) ×4 IMPLANT
DECANTER SPIKE VIAL GLASS SM (MISCELLANEOUS) ×2 IMPLANT
DISSECTOR BLUNT TIP ENDO 5MM (MISCELLANEOUS) ×2 IMPLANT
DRAPE WARM FLUID 44X44 (DRAPE) IMPLANT
DRSG TEGADERM 2-3/8X2-3/4 SM (GAUZE/BANDAGES/DRESSINGS) ×6 IMPLANT
ELECT BLADE 6 FLAT ULTRCLN (ELECTRODE) IMPLANT
ELECT REM PT RETURN 9FT ADLT (ELECTROSURGICAL) ×2
ELECTRODE REM PT RTRN 9FT ADLT (ELECTROSURGICAL) ×1 IMPLANT
EVACUATOR DRAINAGE 10X20 100CC (DRAIN) ×1 IMPLANT
EVACUATOR SILICONE 100CC (DRAIN) ×1
FILTER SMOKE EVAC LAPAROSHD (FILTER) ×2 IMPLANT
FORMALIN 10 PREFIL 120ML (MISCELLANEOUS) ×2 IMPLANT
GLOVE ECLIPSE 6.5 STRL STRAW (GLOVE) ×2 IMPLANT
GLOVE ECLIPSE 7.0 STRL STRAW (GLOVE) ×2 IMPLANT
GLOVE EXAM NITRILE MD LF STRL (GLOVE) ×2 IMPLANT
GLOVE INDICATOR 7.0 STRL GRN (GLOVE) ×4 IMPLANT
GLOVE SKINSENSE NS SZ7.0 (GLOVE) ×1
GLOVE SKINSENSE STRL SZ7.0 (GLOVE) ×1 IMPLANT
GOWN STRL REIN XL XLG (GOWN DISPOSABLE) ×6 IMPLANT
HEMOSTAT SURGICEL 4X8 (HEMOSTASIS) ×2 IMPLANT
INST SET LAPROSCOPIC AP (KITS) ×2 IMPLANT
IV NS IRRIG 3000ML ARTHROMATIC (IV SOLUTION) ×2 IMPLANT
KIT ROOM TURNOVER APOR (KITS) ×2 IMPLANT
KIT TROCAR LAP CHOLE (TROCAR) ×2 IMPLANT
MANIFOLD NEPTUNE II (INSTRUMENTS) ×2 IMPLANT
NS IRRIG 1000ML POUR BTL (IV SOLUTION) ×2 IMPLANT
PACK LAP CHOLE LZT030E (CUSTOM PROCEDURE TRAY) ×2 IMPLANT
PAD ARMBOARD 7.5X6 YLW CONV (MISCELLANEOUS) ×2 IMPLANT
PENCIL HANDSWITCHING (ELECTRODE) IMPLANT
POUCH SPECIMEN RETRIEVAL 10MM (ENDOMECHANICALS) ×2 IMPLANT
SET BASIN LINEN APH (SET/KITS/TRAYS/PACK) ×2 IMPLANT
SET TUBE IRRIG SUCTION NO TIP (IRRIGATION / IRRIGATOR) ×2 IMPLANT
SOL PREP PROV IODINE SCRUB 4OZ (MISCELLANEOUS) ×2 IMPLANT
SPONGE DRAIN TRACH 4X4 STRL 2S (GAUZE/BANDAGES/DRESSINGS) ×2 IMPLANT
SPONGE GAUZE 4X4 12PLY (GAUZE/BANDAGES/DRESSINGS) ×2 IMPLANT
SPONGE INTESTINAL PEANUT (DISPOSABLE) ×2 IMPLANT
SPONGE LAP 18X18 X RAY DECT (DISPOSABLE) ×4 IMPLANT
STAPLER VISISTAT 35W (STAPLE) ×2 IMPLANT
SUT ETHILON 3 0 FSL (SUTURE) ×2 IMPLANT
SUT SILK 2 0 (SUTURE)
SUT SILK 2 0 SH (SUTURE) IMPLANT
SUT SILK 2-0 18XBRD TIE 12 (SUTURE) IMPLANT
SUT SILK 3 0 SH CR/8 (SUTURE) IMPLANT
SUT VIC AB 0 CT1 27 (SUTURE)
SUT VIC AB 0 CT1 27XBRD ANTBC (SUTURE) IMPLANT
SUT VIC AB 0 CT1 27XCR 8 STRN (SUTURE) IMPLANT
SUT VICRYL 0 UR6 27IN ABS (SUTURE) ×4 IMPLANT
SYR BULB IRRIGATION 50ML (SYRINGE) IMPLANT
TAPE CLOTH SURG 4X10 WHT LF (GAUZE/BANDAGES/DRESSINGS) ×2 IMPLANT
TOWEL OR 17X26 4PK STRL BLUE (TOWEL DISPOSABLE) IMPLANT
TRAY FOLEY CATH 14FR (SET/KITS/TRAYS/PACK) ×2 IMPLANT
WARMER LAPAROSCOPE (MISCELLANEOUS) ×2 IMPLANT
WATER STERILE IRR 1000ML POUR (IV SOLUTION) ×4 IMPLANT
YANKAUER SUCT BULB TIP 10FT TU (MISCELLANEOUS) IMPLANT

## 2012-06-05 NOTE — Plan of Care (Signed)
Problem: Diagnosis - Type of Surgery Goal: General Surgical Patient Education (See Patient Education module for education specifics) Outcome: Completed/Met Date Met:  06/05/12 Laparoscopic cholesystectomy   Problem: Phase I Progression Outcomes Goal: Tubes/drains patent Outcome: Progressing jpx1 to abd, small amount of serosanguinous drainage Goal: Initial discharge plan identified Outcome: Completed/Met Date Met:  06/05/12 Plan to d/c home with spouse

## 2012-06-05 NOTE — Progress Notes (Signed)
Admit via OP dept. 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 Cholecystectomy.  Procedure and risks addressed and informed consent obtained.  Labs reviewed and all questions answered.  No clinical changes in H&P.  Dict. # [id number]

## 2012-06-05 NOTE — Anesthesia Postprocedure Evaluation (Signed)
  Anesthesia Post-op Note  Patient: Maria Walton  Procedure(s) Performed: Procedure(s) (LRB): LAPAROSCOPIC CHOLECYSTECTOMY (N/A)  Patient Location: PACU  Anesthesia Type: General  Level of Consciousness: awake, alert  and oriented  Airway and Oxygen Therapy: Patient Spontanous Breathing and Patient connected to face mask oxygen  Post-op Pain: none  Post-op Assessment: Post-op Vital signs reviewed, Patient's Cardiovascular Status Stable, Respiratory Function Stable, Patent Airway and No signs of Nausea or vomiting  Post-op Vital Signs: Reviewed and stable  Complications: No apparent anesthesia complications

## 2012-06-05 NOTE — Anesthesia Preprocedure Evaluation (Addendum)
Anesthesia Evaluation  Patient identified by MRN, date of birth, ID band Patient awake    Reviewed: Allergy & Precautions, H&P , NPO status , Patient's Chart, lab work & pertinent test results  History of Anesthesia Complications (+) PONV  Airway Mallampati: II      Dental  (+) Teeth Intact   Pulmonary neg pulmonary ROS,  breath sounds clear to auscultation        Cardiovascular negative cardio ROS  Rhythm:Regular Rate:Normal     Neuro/Psych  Headaches,    GI/Hepatic negative GI ROS,   Endo/Other    Renal/GU      Musculoskeletal   Abdominal   Peds  Hematology   Anesthesia Other Findings   Reproductive/Obstetrics                           Anesthesia Physical Anesthesia Plan  ASA: I  Anesthesia Plan: General   Post-op Pain Management:    Induction: Intravenous  Airway Management Planned: Oral ETT  Additional Equipment:   Intra-op Plan:   Post-operative Plan: Extubation in OR  Informed Consent: I have reviewed the patients History and Physical, chart, labs and discussed the procedure including the risks, benefits and alternatives for the proposed anesthesia with the patient or authorized representative who has indicated his/her understanding and acceptance.     Plan Discussed with:   Anesthesia Plan Comments:         Anesthesia Quick Evaluation

## 2012-06-05 NOTE — Transfer of Care (Signed)
Immediate Anesthesia Transfer of Care Note  Patient: Maria Walton  Procedure(s) Performed: Procedure(s) (LRB): LAPAROSCOPIC CHOLECYSTECTOMY (N/A)  Patient Location: PACU  Anesthesia Type: General  Level of Consciousness: awake, alert  and oriented  Airway & Oxygen Therapy: Patient Spontanous Breathing and Patient connected to face mask oxygen  Post-op Assessment: Report given to PACU RN  Post vital signs: Reviewed and stable  Complications: No apparent anesthesia complications

## 2012-06-05 NOTE — Op Note (Signed)
Maria Walton, Maria Walton NO.:  [id number]  MEDICAL RECORD NO.:  [id number]  LOCATION:  APPO                          FACILITY:  [facility]  PHYSICIAN:  Barbaraann Barthel, M.D. DATE OF BIRTH:  06-21-1986  DATE OF PROCEDURE:  06/05/2012 DATE OF DISCHARGE:                              OPERATIVE REPORT   PREOPERATIVE DIAGNOSIS:  Cholecystitis cholelithiasis.  POSTOPERATIVE DIAGNOSIS:  Cholecystitis cholelithiasis.  PROCEDURE:  Laparoscopic cholecystectomy.  SURGEON:  Barbaraann Barthel, MD  SPECIMEN:  Gallbladder and stones.  WOUND CLASSIFICATION:  Clean contaminated.  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  This was worse over the last week and she was seen in the emergency room where CT scan revealed cholelithiasis.  I later ordered a sonogram to further evaluate the common bile duct etc and the right upper quadrant showed some minimal thickening of the gallbladder with normal size common bile duct and at least one fairly large stones about a half a centimeter in diameter within the gallbladder.  The right upper quadrant otherwise appeared to be normal.  Her liver function studies and pancreatic enzymes were not elevated.  We discussed elective surgery via the outpatient department in detail discussing complications not limited to, but including bleeding, infection, damage to bile ducts, perforation of organs, transitory diarrhea, and the possibility of open cholecystectomy might be required.  Informed consent was obtained.  TECHNIQUE:  The patient was placed in supine position and after the adequate administration of general anesthesia via endotracheal intubation, her entire abdomen was prepped with Betadine solution and draped in usual manner.  Prior to this, a Foley catheter was aseptically inserted.  After prepping and draping, a periumbilical incision  was carried out over the superior aspect of the umbilicus where an 11-mm cannula was placed.  Another 11 mm cannula was placed in the epigastrium and two 5 mm cannulas were placed in the right upper quadrant laterally. The gallbladder was grasped.  Its adhesions were taken down.  There were minimal adhesions.  The cystic duct was clearly visualized.  There was a prominent cystic artery with a Calot node attached to it.  This was triply silver clipped and divided as was the cystic artery.  The gallbladder was then removed uneventfully from the liver bed using the hook cautery device.  There was no spillage of gallbladder material.  We then irrigated and checked for hemostasis and then I elected to leave the Surgicel in the liver bed and a Jackson-Pratt drain in the liver bed as well.  This exited through lateral most port site.  The abdomen was then desufflated and the fascia was closed in the area of the epigastrium and the umbilicus with 0 Polysorb suture and then the skin was approximated with stapling device.  Sensorcaine 0.5% was used for postoperative comfort at all port sites.  Prior to closure, all sponge, needle, and instrument counts were found to be correct.  ESTIMATED BLOOD LOSS:  Less than 25 mL.  The patient received approximately 900 mL of crystalloids intraoperatively.  There were no complications.     Barbaraann Barthel,  M.D.     Jodi Marble  D:  06/05/2012  T:  06/05/2012  Job:  [id number]  cc:   Dr. Amador Cunas

## 2012-06-05 NOTE — Anesthesia Procedure Notes (Signed)
Procedure Name: Intubation Date/Time: 06/05/2012 8:28 AM Performed by: Glynn Octave E Pre-anesthesia Checklist: Patient identified, Patient being monitored, Timeout performed, Emergency Drugs available and Suction available Patient Re-evaluated:Patient Re-evaluated prior to inductionOxygen Delivery Method: Circle System Utilized Preoxygenation: Pre-oxygenation with 100% oxygen Intubation Type: IV induction Ventilation: Mask ventilation without difficulty Laryngoscope Size: Mac and 3 Grade View: Grade I Tube type: Oral Tube size: 7.0 mm Number of attempts: 1 Airway Equipment and Method: stylet Placement Confirmation: ETT inserted through vocal cords under direct vision,  positive ETCO2 and breath sounds checked- equal and bilateral Secured at: 22 cm Tube secured with: Tape Dental Injury: Teeth and Oropharynx as per pre-operative assessment

## 2012-06-05 NOTE — Progress Notes (Signed)
Post OP Check  Awake and alert. Some expected incisional pain.  Wound clean and dry with min. Serous JP drainage. Pt has voided. Doing well post op.  Filed Vitals:   06/05/12 1432  BP: 106/69  Pulse: 97  Temp: 98.1 F (36.7 C)  Resp: 16

## 2012-06-05 NOTE — Brief Op Note (Signed)
06/05/2012  9:58 AM  PATIENT:  Geanie Berlin  26 y.o. female  PRE-OPERATIVE DIAGNOSIS:  cholelithiasis  POST-OPERATIVE DIAGNOSIS:  cholelithiasis  PROCEDURE:  Procedure(s) (LRB): LAPAROSCOPIC CHOLECYSTECTOMY (N/A)  SURGEON:  Surgeon(s) and Role:    * Marlane Hatcher, MD - Primary  PHYSICIAN ASSISTANT:   ASSISTANTS: none   ANESTHESIA:   general  EBL:  Total I/O In: 600 [I.V.:600] Out: 125 [Urine:100; Blood:25]  BLOOD ADMINISTERED:none  DRAINS: Penrose drain in the placed in liver bed   LOCAL MEDICATIONS USED:  MARCAINE 10 cc    SPECIMEN:  Source of Specimen:  gallbladder and stones  DISPOSITION OF SPECIMEN:  PATHOLOGY  COUNTS:  YES  TOURNIQUET:  * No tourniquets in log *  DICTATION: .Other Dictation: Dictation Number OR dict. # [id number]  PLAN OF CARE: Admit for overnight observation  PATIENT DISPOSITION:  PACU - hemodynamically stable.   Delay start of Pharmacological VTE agent (>24hrs) due to surgical blood loss or risk of bleeding: not applicable

## 2012-06-06 ENCOUNTER — Encounter (HOSPITAL_COMMUNITY): Payer: Self-pay | Admitting: General Surgery

## 2012-06-06 LAB — CBC
HCT: 37.1 % (ref 36.0–46.0)
Hemoglobin: 12.3 g/dL (ref 12.0–15.0)
RBC: 3.92 MIL/uL (ref 3.87–5.11)
WBC: 11.2 10*3/uL — ABNORMAL HIGH (ref 4.0–10.5)

## 2012-06-06 LAB — BASIC METABOLIC PANEL
BUN: 5 mg/dL — ABNORMAL LOW (ref 6–23)
Chloride: 107 mEq/L (ref 96–112)
GFR calc Af Amer: >90 mL/min (ref >90)
GFR calc non Af Amer: >90 mL/min (ref >90)
Potassium: 4.3 mEq/L (ref 3.5–5.1)
Sodium: 140 mEq/L (ref 135–145)

## 2012-06-06 LAB — HEPATIC FUNCTION PANEL
ALT: 21 U/L (ref 0–35)
AST: 22 U/L (ref 0–37)
Albumin: 3.5 g/dL (ref 3.5–5.2)
Alkaline Phosphatase: 38 U/L — ABNORMAL LOW (ref 39–117)
Total Bilirubin: 0.8 mg/dL (ref 0.3–1.2)
Total Protein: 6.6 g/dL (ref 6.0–8.3)

## 2012-06-06 MED ORDER — OXYCODONE-ACETAMINOPHEN 5-325 MG PO TABS: 1.0000 | ORAL_TABLET | ORAL | Status: AC | PRN

## 2012-06-06 NOTE — Progress Notes (Signed)
UR Chart Review Completed  

## 2012-06-06 NOTE — Anesthesia Postprocedure Evaluation (Signed)
  Anesthesia Post-op Note  Patient: Maria Walton  Procedure(s) Performed: Procedure(s) (LRB): LAPAROSCOPIC CHOLECYSTECTOMY (N/A)  Patient Location: room 324  Anesthesia Type: General  Level of Consciousness: awake, alert , oriented and patient cooperative  Airway and Oxygen Therapy: Patient Spontanous Breathing  Post-op Pain: 2 /10, mild  Post-op Assessment: Post-op Vital signs reviewed, Patient's Cardiovascular Status Stable, Respiratory Function Stable, Patent Airway, No signs of Nausea or vomiting, Adequate PO intake and Pain level controlled  Post-op Vital Signs: Reviewed and stable  Complications: No apparent anesthesia complications

## 2012-06-06 NOTE — Discharge Summary (Signed)
NAMEKEELA, Maria Walton NO.:  [id number]  MEDICAL RECORD NO.:  [id number]  LOCATION:  A324                          FACILITY:  [facility]  PHYSICIAN:  Barbaraann Barthel, M.D. DATE OF BIRTH:  09-08-1986  DATE OF ADMISSION:  06/05/2012 DATE OF DISCHARGE:  LH                              DISCHARGE SUMMARY   DIAGNOSES:  Cholecystitis, cholelithiasis.  PROCEDURE:  On June 05, 2012, laparoscopic cholecystectomy.  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  She was admitted via the outpatient department and a laparoscopic cholecystectomy was performed. Preoperatively, sonogram showed stones within the gallbladder with minimally thickened gallbladder.  Her liver function studies and liver enzymes were not elevated.  Postoperatively, her hospital course was completely uneventful.  Her wound on the 1st postoperative day was clean as she had only serous drainage from the Jackson-Pratt drain which was removed and her dressings were clean and dry and redressed.  At the time of discharge, she was tolerating liquids well.  She had no dysuria or leg pain and her abdomen was soft.  Discharge instructions were given and we will follow up with her perioperatively.     Barbaraann Barthel, M.D.     WB/MEDQ  D:  06/06/2012  T:  06/06/2012  Job:  [id number]

## 2012-06-06 NOTE — Progress Notes (Signed)
Discharge instructions reviewed with patient, patient voiced understanding. Patient given discharge instructions and prescriptions. Patient complains of blurred vision when trying to read or use her phone. Patient stated she does not have any blurred vision when she stands to walk. BP 92/54, HR 66 called Dr. Malvin Johns who stated she can still go home if her blood pressure is ok. Patient's blood pressure has been consistent with being around 92/54. Patient is going to stay here until she feels better. Will continue to monitor.

## 2012-06-06 NOTE — Progress Notes (Signed)
POD#1  Pt. Doing very well post op. Wound clean and dry with only serous JP drainage.  Dressing changed and drain removed.Abdomen soft no dysuria or leg pain.  Post op labs OK bili normal.  Filed Vitals:   06/06/12 0615  BP: 93/55  Pulse: 77  Temp: 98.1 F (36.7 C)  Resp: 18  discharge dict. # [id number].

## 2012-06-06 NOTE — Addendum Note (Signed)
Addendum  created 06/06/12 1111 by Despina Hidden, CRNA   Modules edited:Notes Section

## 2012-06-06 NOTE — Progress Notes (Signed)
Patient feels better and is in stable condition. Patient transported out by tech.

## 2012-07-21 ENCOUNTER — Telehealth: Payer: Self-pay | Admitting: *Deleted

## 2012-07-21 NOTE — Telephone Encounter (Signed)
Pt informed with the below note. 

## 2012-07-21 NOTE — Telephone Encounter (Signed)
Recommend office visit for evaluation

## 2012-07-21 NOTE — Telephone Encounter (Signed)
Pt calling to follow up with telephone encounter 02/21/12 pt cycle did regulate, but now pt said no cycle since may. She has taking 2 UPT and both are negative. She had gallbladder surgery in June just as an update with history. Please advise

## 2012-07-24 ENCOUNTER — Ambulatory Visit (INDEPENDENT_AMBULATORY_CARE_PROVIDER_SITE_OTHER): Payer: 59 | Admitting: Gynecology

## 2012-07-24 ENCOUNTER — Encounter: Payer: Self-pay | Admitting: Gynecology

## 2012-07-24 DIAGNOSIS — Z309 Encounter for contraceptive management, unspecified: Secondary | ICD-10-CM

## 2012-07-24 DIAGNOSIS — N912 Amenorrhea, unspecified: Secondary | ICD-10-CM

## 2012-07-24 NOTE — Patient Instructions (Signed)
Office will call you if any lab work is abnormal. Keep taking birth control pills as prescribed. As long as no periods or regular periods we'll monitor. If you have atypical or prolonged bleeding call.

## 2012-07-24 NOTE — Progress Notes (Signed)
Patient presents having been started on Loestrin 120 equivalence in the beginning of this year subsequently had a cholecystectomy laparoscopic in June. Her last menstrual period was mid May and she has not had a period since. She has not having hot flushes night sweats weight gain weight loss skin or hair changes. Otherwise doing well on the pill taking them routinely.  Exam was Sherrilyn Rist assistant Abdomen soft nontender without masses guarding rebound organomegaly. Pelvic external BUS vagina normal. Cervix normal. Uterus retroverted normal size midline mobile nontender. Adnexa without masses or tenderness.  Assessment and plan: Missed menses either due to stress or low-dose oral contraceptives. We'll check baseline labs to include hCG TSH FSH prolactin. Assuming all negative she'll keep a menstrual calendar as long as no menses or regular menses we'll monitor. If irregular bleeding she'll represent for further evaluation.

## 2012-10-19 ENCOUNTER — Other Ambulatory Visit: Payer: Self-pay | Admitting: Gynecology

## 2012-10-19 MED ORDER — NORETHINDRONE ACET-ETHINYL EST 1-20 MG-MCG PO TABS: 1.0000 | ORAL_TABLET | Freq: Every day | ORAL | Status: DC

## 2012-12-08 ENCOUNTER — Encounter: Payer: 59 | Admitting: Gynecology

## 2012-12-25 ENCOUNTER — Ambulatory Visit (INDEPENDENT_AMBULATORY_CARE_PROVIDER_SITE_OTHER): Payer: 59 | Admitting: Gynecology

## 2012-12-25 ENCOUNTER — Encounter: Payer: Self-pay | Admitting: Gynecology

## 2012-12-25 VITALS — BP 108/60 | Ht 67.0 in | Wt 100.0 lb

## 2012-12-25 DIAGNOSIS — Z01419 Encounter for gynecological examination (general) (routine) without abnormal findings: Secondary | ICD-10-CM

## 2012-12-25 DIAGNOSIS — R42 Dizziness and giddiness: Secondary | ICD-10-CM | POA: Insufficient documentation

## 2012-12-25 DIAGNOSIS — Z309 Encounter for contraceptive management, unspecified: Secondary | ICD-10-CM

## 2012-12-25 NOTE — Progress Notes (Signed)
Maria Walton 1986-03-06 [id number]        27 y.o.  G0P0 for annual exam.  Several issues below.  Past medical history,surgical history, medications, allergies, family history and social history were all reviewed and documented in the EPIC chart. ROS:  Was performed and pertinent positives and negatives are included in the history.  Exam: Kim assistant Filed Vitals:   12/25/12 1125  BP: 108/60  Height: 5\' 7"  (1.702 m)  Weight: 100 lb (45.36 kg)   General appearance  Normal Skin grossly normal Head/Neck normal with no cervical or supraclavicular adenopathy thyroid normal Lungs  clear Cardiac RR, without RMG Abdominal  soft, nontender, without masses, organomegaly or hernia Breasts  examined lying and sitting without masses, retractions, discharge or axillary adenopathy. Pelvic  Ext/BUS/vagina  normal   Cervix  normal   Uterus  anteverted, normal size, shape and contour, midline and mobile nontender   Adnexa  Without masses or tenderness    Anus and perineum  normal     Assessment/Plan:  27 y.o. G0P0 female for annual exam.   1. Contraceptive management.  Patient had episodes of lightheadedness following the initiation of her BCPs. They transiently got better for a month or 2 and then returned. She saw her primary who worked her up to include EKG and blood work and did not feel that was neurogenic/cardiogenic or metabolic. They decided to stop her birth control pills and her dizziness seemed to have result.  Patient currently not using contraception. I reviewed options for contraception to include pill patch ring with possible different pill formulation, Implanon/Depo-Provera and IUD to include Skyla Mirena and ParaGard. I reviewed was involved with each option. After lengthy discussion she wants to try Lo Loestrin for a different BCP formulation.  I gave her 2 sample packs initial Sunday start after next menses and then call me into the second back to see how she's doing and we'll  make a decision for long-term contraception at that time. 2. Breast health. SBE monthly reviewed. 3. Pap smear 2012. No Pap smear done today. Plan repeat at 3 year interval per current screening guidelines. No history of abnormal Pap smears previously. 4. Gardasil previously discussed and declined. 5. Health maintenance. No blood work done as she recently had done all through her primary physician's office.  Follow up with results from BCP trial. Otherwise annually.   Dara Lords MD, 12:16 PM 12/25/2012

## 2012-12-25 NOTE — Patient Instructions (Signed)
Try new birth control pills. Start Sunday after next menses. Call me into the second pack and we'll see how you're doing and refill is doing well or discussed alternatives if not. Follow up in one year for annual exam.

## 2012-12-26 LAB — URINALYSIS W MICROSCOPIC + REFLEX CULTURE
Bilirubin Urine: NEGATIVE
Crystals: NONE SEEN
Glucose, UA: NEGATIVE mg/dL
Leukocytes, UA: NEGATIVE
Specific Gravity, Urine: 1.01 (ref 1.005–1.030)
Squamous Epithelial / LPF: NONE SEEN
Urobilinogen, UA: 0.2 mg/dL (ref 0.0–1.0)

## 2013-02-07 ENCOUNTER — Telehealth: Payer: Self-pay | Admitting: *Deleted

## 2013-02-07 MED ORDER — NORETHIN-ETH ESTRAD-FE BIPHAS 1 MG-10 MCG / 10 MCG PO TABS: 1.0000 | ORAL_TABLET | Freq: Every day | ORAL | Status: DC

## 2013-02-07 NOTE — Telephone Encounter (Signed)
Pt called requesting Rx for lo Loestrin Fe, Rx sent. Pt was giving samples in office visit on 12/25/12, per note okay to send.

## 2013-12-27 ENCOUNTER — Encounter: Payer: Self-pay | Admitting: Gynecology

## 2013-12-27 ENCOUNTER — Ambulatory Visit (INDEPENDENT_AMBULATORY_CARE_PROVIDER_SITE_OTHER): Payer: 59 | Admitting: Gynecology

## 2013-12-27 VITALS — BP 112/66 | Ht 67.5 in | Wt 100.0 lb

## 2013-12-27 DIAGNOSIS — Z01419 Encounter for gynecological examination (general) (routine) without abnormal findings: Secondary | ICD-10-CM

## 2013-12-27 LAB — CBC WITH DIFFERENTIAL/PLATELET
Basophils Absolute: 0 10*3/uL (ref 0.0–0.1)
Basophils Relative: 0 % (ref 0–1)
EOS PCT: 2 % (ref 0–5)
Eosinophils Absolute: 0.1 10*3/uL (ref 0.0–0.7)
HEMATOCRIT: 39.5 % (ref 36.0–46.0)
Hemoglobin: 13.4 g/dL (ref 12.0–15.0)
LYMPHS ABS: 1.7 10*3/uL (ref 0.7–4.0)
LYMPHS PCT: 32 % (ref 12–46)
MCH: 30.7 pg (ref 26.0–34.0)
MCHC: 33.9 g/dL (ref 30.0–36.0)
MCV: 90.6 fL (ref 78.0–100.0)
MONO ABS: 0.3 10*3/uL (ref 0.1–1.0)
MONOS PCT: 6 % (ref 3–12)
Neutro Abs: 3.2 10*3/uL (ref 1.7–7.7)
Neutrophils Relative %: 60 % (ref 43–77)
Platelets: 325 10*3/uL (ref 150–400)
RBC: 4.36 MIL/uL (ref 3.87–5.11)
RDW: 12.8 % (ref 11.5–15.5)
WBC: 5.3 10*3/uL (ref 4.0–10.5)

## 2013-12-27 LAB — URINALYSIS W MICROSCOPIC + REFLEX CULTURE
Bacteria, UA: NONE SEEN
Bilirubin Urine: NEGATIVE
CASTS: NONE SEEN
Crystals: NONE SEEN
Glucose, UA: NEGATIVE mg/dL
HGB URINE DIPSTICK: NEGATIVE
Ketones, ur: NEGATIVE mg/dL
LEUKOCYTES UA: NEGATIVE
NITRITE: NEGATIVE
PH: 6 (ref 5.0–8.0)
PROTEIN: NEGATIVE mg/dL
Specific Gravity, Urine: 1.019 (ref 1.005–1.030)
Urobilinogen, UA: 0.2 mg/dL (ref 0.0–1.0)

## 2013-12-27 NOTE — Patient Instructions (Signed)
Followup in one year for annual exam. Sooner if you achieve pregnancy. Continue on a multivitamin with folic acid

## 2013-12-27 NOTE — Progress Notes (Signed)
Geanie Berlinshley J Prigmore Feb 10, 1986 [id number]        28 y.o.  G0P0 for annual exam.  Doing well.  Past medical history,surgical history, problem list, medications, allergies, family history and social history were all reviewed and documented in the EPIC chart.  ROS:  Performed and pertinent positives and negatives are included in the history, assessment and plan .  Exam: Kim assistant Filed Vitals:   12/27/13 0939  BP: 112/66  Height: 5' 7.5" (1.715 m)  Weight: 100 lb (45.36 kg)   General appearance  Normal Skin grossly normal Head/Neck normal with no cervical or supraclavicular adenopathy thyroid normal Lungs  clear Cardiac RR, without RMG Abdominal  soft, nontender, without masses, organomegaly or hernia Breasts  examined lying and sitting without masses, retractions, discharge or axillary adenopathy. Pelvic  Ext/BUS/vagina  Normal  Cervix  Normal  Uterus  anteverted, normal size, shape and contour, midline and mobile nontender   Adnexa  Without masses or tenderness    Anus and perineum  Normal    Assessment/Plan:  28 y.o. G0P0 female for annual exam regular menses, no birth control.   1. Contraception. Patient stopped her birth control pills in pursuit of pregnancy. Having regular monthly menses. On a multivitamin with folic acid. Patient will followup once she achieves pregnancy for viability documentation. She understands we do not do obstetrics and she will need to establish care with an obstetrical group in town. If she does not achieve pregnancy after consistently trying over the next 6 months to one year she'll represent for evaluation. 2. Breast health. SBE monthly reviewed. 3. Pap smear 11/2011. No Pap smear done today. No history of abnormal Pap smears. Plan repeat Pap smear next year at 3 year interval. 4. Health maintenance. Baseline CBC urinalysis ordered. Followup in one year, sooner as needed.   Note: This document was prepared with digital dictation and possible smart  phrase technology. Any transcriptional errors that result from this process are unintentional.   Dara LordsFONTAINE,Bunnie Rehberg P MD, 10:16 AM 12/27/2013

## 2013-12-30 LAB — URINE CULTURE: Colony Count: >=100000

## 2013-12-31 ENCOUNTER — Other Ambulatory Visit: Payer: Self-pay | Admitting: Gynecology

## 2013-12-31 MED ORDER — AMPICILLIN 500 MG PO CAPS: 500.0000 mg | ORAL_CAPSULE | Freq: Four times a day (QID) | ORAL | Status: DC

## 2014-07-05 DIAGNOSIS — Z0289 Encounter for other administrative examinations: Secondary | ICD-10-CM

## 2014-09-03 ENCOUNTER — Ambulatory Visit (INDEPENDENT_AMBULATORY_CARE_PROVIDER_SITE_OTHER): Payer: BC Managed Care – PPO | Admitting: Gynecology

## 2014-09-03 ENCOUNTER — Encounter: Payer: Self-pay | Admitting: Gynecology

## 2014-09-03 DIAGNOSIS — R300 Dysuria: Secondary | ICD-10-CM

## 2014-09-03 DIAGNOSIS — N898 Other specified noninflammatory disorders of vagina: Secondary | ICD-10-CM

## 2014-09-03 DIAGNOSIS — L293 Anogenital pruritus, unspecified: Secondary | ICD-10-CM

## 2014-09-03 LAB — URINALYSIS W MICROSCOPIC + REFLEX CULTURE
Bilirubin Urine: NEGATIVE
Casts: NONE SEEN
Crystals: NONE SEEN
GLUCOSE, UA: NEGATIVE mg/dL
Ketones, ur: NEGATIVE mg/dL
NITRITE: NEGATIVE
PROTEIN: NEGATIVE mg/dL
Specific Gravity, Urine: <1.005 — ABNORMAL LOW (ref 1.005–1.030)
UROBILINOGEN UA: 0.2 mg/dL (ref 0.0–1.0)
pH: 6.5 (ref 5.0–8.0)

## 2014-09-03 LAB — WET PREP FOR TRICH, YEAST, CLUE: TRICH WET PREP: NONE SEEN

## 2014-09-03 MED ORDER — SULFAMETHOXAZOLE-TRIMETHOPRIM 800-160 MG PO TABS: 1.0000 | ORAL_TABLET | Freq: Two times a day (BID) | ORAL | Status: DC

## 2014-09-03 MED ORDER — FLUCONAZOLE 150 MG PO TABS: 150.0000 mg | ORAL_TABLET | Freq: Once | ORAL | Status: DC

## 2014-09-03 NOTE — Patient Instructions (Signed)
Take 1 Diflucan pill and take the second pill 2 days later Take the Septra antibiotic twice daily for 3 days  Follow up if the symptoms persist, worsen or recur.

## 2014-09-03 NOTE — Progress Notes (Signed)
Maria Walton 1986/05/19 [id number]        28 y.o.  G0P0 with the onset of vaginal itching several weeks ago. Had her menses and the symptoms seemed to clear transiently but now with vaginal itching, discharge, dysuria and frequency. No fever chills or odor.  Past medical history,surgical history, problem list, medications, allergies, family history and social history were all reviewed and documented in the EPIC chart.  Directed ROS with pertinent positives and negatives documented in the history of present illness/assessment and plan.  Exam: Kim assistant General appearance:  Normal Spine straight without CVA tenderness Abdomen soft nontender without masses guarding rebound organomegaly Pelvic external BUS vagina with thick white cakey discharge. Cervix normal. Uterus normal size midline mobile nontender. Adnexa without as.  Assessment/Plan:  28 y.o. G0P0 with history, exam and wet prep consistent with yeast vulvovaginitis. Urinalysis is also suspicious for UTI.  We'll treat with Diflucan 150 mg every other day x2 doses. Septra DS 1 by mouth twice a day x3 days. Follow up if symptoms persist, worsen or recur.     Dara Lords MD, 2:41 PM 09/03/2014

## 2014-09-04 LAB — URINE CULTURE

## 2014-12-17 ENCOUNTER — Encounter: Payer: Self-pay | Admitting: Internal Medicine

## 2014-12-30 ENCOUNTER — Emergency Department (HOSPITAL_COMMUNITY)
Admission: EM | Admit: 2014-12-30 | Discharge: 2014-12-30 | Disposition: A | Discharge status: Home / Self Care | Payer: BC Managed Care – PPO | Attending: Emergency Medicine | Admitting: Emergency Medicine

## 2014-12-30 ENCOUNTER — Encounter (HOSPITAL_COMMUNITY): Payer: Self-pay | Admitting: Emergency Medicine

## 2014-12-30 ENCOUNTER — Emergency Department (HOSPITAL_COMMUNITY): Payer: BC Managed Care – PPO

## 2014-12-30 DIAGNOSIS — R0602 Shortness of breath: Secondary | ICD-10-CM | POA: Diagnosis not present

## 2014-12-30 DIAGNOSIS — R002 Palpitations: Secondary | ICD-10-CM | POA: Diagnosis present

## 2014-12-30 DIAGNOSIS — R55 Syncope and collapse: Secondary | ICD-10-CM | POA: Insufficient documentation

## 2014-12-30 DIAGNOSIS — Z3202 Encounter for pregnancy test, result negative: Secondary | ICD-10-CM | POA: Insufficient documentation

## 2014-12-30 DIAGNOSIS — Z79899 Other long term (current) drug therapy: Secondary | ICD-10-CM | POA: Insufficient documentation

## 2014-12-30 LAB — URINE MICROSCOPIC-ADD ON

## 2014-12-30 LAB — URINALYSIS, ROUTINE W REFLEX MICROSCOPIC
BILIRUBIN URINE: NEGATIVE
GLUCOSE, UA: NEGATIVE mg/dL
KETONES UR: NEGATIVE mg/dL
NITRITE: NEGATIVE
PH: 8 (ref 5.0–8.0)
Protein, ur: 30 mg/dL — AB
Specific Gravity, Urine: 1.015 (ref 1.005–1.030)
Urobilinogen, UA: 0.2 mg/dL (ref 0.0–1.0)

## 2014-12-30 LAB — BASIC METABOLIC PANEL
Anion gap: 8 (ref 5–15)
BUN: 11 mg/dL (ref 6–23)
CO2: 30 mmol/L (ref 19–32)
CREATININE: 0.6 mg/dL (ref 0.50–1.10)
Calcium: 9.4 mg/dL (ref 8.4–10.5)
Chloride: 102 mEq/L (ref 96–112)
GFR calc non Af Amer: >90 mL/min (ref >90)
Glucose, Bld: 83 mg/dL (ref 70–99)
Potassium: 3.6 mmol/L (ref 3.5–5.1)
SODIUM: 140 mmol/L (ref 135–145)

## 2014-12-30 LAB — PREGNANCY, URINE: Preg Test, Ur: NEGATIVE

## 2014-12-30 LAB — D-DIMER, QUANTITATIVE: D-Dimer, Quant: <0.27 ug/mL-FEU (ref 0.00–0.48)

## 2014-12-30 LAB — CBC
HEMATOCRIT: 39.1 % (ref 36.0–46.0)
HEMOGLOBIN: 13.1 g/dL (ref 12.0–15.0)
MCH: 31.4 pg (ref 26.0–34.0)
MCHC: 33.5 g/dL (ref 30.0–36.0)
MCV: 93.8 fL (ref 78.0–100.0)
PLATELETS: 293 10*3/uL (ref 150–400)
RBC: 4.17 MIL/uL (ref 3.87–5.11)
RDW: 12.4 % (ref 11.5–15.5)
WBC: 5.4 10*3/uL (ref 4.0–10.5)

## 2014-12-30 LAB — TSH: TSH: 1.705 u[IU]/mL (ref 0.350–4.500)

## 2014-12-30 NOTE — ED Notes (Signed)
Patient ambulated to bathroom with no assistance. Gait steady during ambulation. Patient complained of dizziness upon standing.

## 2014-12-30 NOTE — ED Notes (Addendum)
Called to patient's room by family member. Patient complaining of sudden onset of dizziness. States "it feels like everything starts to get dark and I see black spots." Also complaining of "flushing and sweating." Dr Patria Maneampos notified. Patient's B/P 83/69 upon entering room.

## 2014-12-30 NOTE — ED Notes (Signed)
Patient stated she was dizzy upon sitting and standing although patient did not require any assistance standing.  Patient stood still.

## 2014-12-30 NOTE — ED Notes (Signed)
Pt had heart racing x2 weeks, never has felt like she would pass out, today SOB, seeing spots, and  Had to showering sitting down, felt like she would black out.

## 2015-01-01 NOTE — ED Provider Notes (Signed)
CSN: [id number]     Arrival date & time 12/30/14  1133 History   First MD Initiated Contact with Patient 12/30/14 1306     Chief Complaint  Patient presents with  . Palpitations      HPI Patient presents to the emergency department complaining of intermittent palpitations and near-syncope over the past 2 weeks.  She occasionally feels shortness of breath.  Sometimes she sees "spots".  This can occur while she is walking but can also occur while she is just sitting at her desk.  It is not always positional in nature.  She has no pleuritic chest pain.  She denies weakness of her arms or legs.  She states occasional flushing and sweating as well.  She has seen her primary care physician regarding this and has had labs including TSH drawn which were reported back to her as normal.  She denies fevers or chills.  No significant weight loss.  States her appetite has been normal.  No urinary complaints.  No back pain.  No new rash.  No new medications were initiated.  She has never had syncope.  No family history of early cardiac death.  No abnormal vaginal bleeding.   Past Medical History  Diagnosis Date  . Heart palpitations   . Dizziness    Past Surgical History  Procedure Laterality Date  . Appendectomy      dr Lovell Sheehanjenkins 1999  . Cholecystectomy  06/05/2012    Procedure: LAPAROSCOPIC CHOLECYSTECTOMY;  Surgeon: Marlane HatcherWilliam S Bradford, MD;  Location: [facility];  Service: General;  Laterality: N/A;   Family History  Problem Relation Age of Onset  . Hypertension Father   . Diabetes Maternal Grandmother   . Heart disease Maternal Grandmother   . Hypertension Paternal Grandfather   . Breast cancer Paternal Grandmother [id number]   History  Substance Use Topics  . Smoking status: Never Smoker   . Smokeless tobacco: Never Used  . Alcohol Use: No   OB History    Gravida Para Term Preterm AB TAB SAB Ectopic Multiple Living   0              Review of Systems  All other systems reviewed and are  negative.     Allergies  Ceclor and Promethazine hcl  Home Medications   Prior to Admission medications   Medication Sig Start Date End Date Taking? Authorizing Provider  acetaminophen (TYLENOL) 325 MG tablet Take 650 mg by mouth every 6 (six) hours as needed. For pain   Yes Historical Provider, MD  ibuprofen (ADVIL,MOTRIN) 200 MG tablet Take 200 mg by mouth every 6 (six) hours as needed for headache.   Yes Historical Provider, MD  Multiple Vitamin (MULTIVITAMIN) tablet Take 1 tablet by mouth daily.   Yes Historical Provider, MD  pantoprazole (PROTONIX) 40 MG tablet Take 40 mg by mouth daily.    Yes Historical Provider, MD  potassium chloride 20 MEQ/15ML (10%) SOLN Take 20 mEq by mouth daily.   Yes Historical Provider, MD  fluconazole (DIFLUCAN) 150 MG tablet Take 1 tablet (150 mg total) by mouth once. Patient not taking: Reported on 12/30/2014 09/03/14   Dara Lordsimothy P Fontaine, MD  sulfamethoxazole-trimethoprim (SEPTRA DS) 800-160 MG per tablet Take 1 tablet by mouth 2 (two) times daily. For 3 days Patient not taking: Reported on 12/30/2014 09/03/14   Dara Lordsimothy P Fontaine, MD   BP 83/69 mmHg  Pulse 102  Temp(Src) 98 F (36.7 C) (Oral)  Resp 16  Ht 5\' 7"  (1.702 m)  Wt 100 lb (45.36 kg)  BMI 15.66 kg/m2  SpO2 91%  LMP 12/30/2014 Physical Exam  Constitutional: She is oriented to person, place, and time. She appears well-developed and well-nourished. No distress.  HENT:  Head: Normocephalic and atraumatic.  Eyes: EOM are normal. Pupils are equal, round, and reactive to light.  Neck: Normal range of motion.  Cardiovascular: Normal rate, regular rhythm and normal heart sounds.   Pulmonary/Chest: Effort normal and breath sounds normal.  Abdominal: Soft. She exhibits no distension. There is no tenderness.  Musculoskeletal: Normal range of motion.  Neurological: She is alert and oriented to person, place, and time.  5/5 strength in major muscle groups of  bilateral upper and lower  extremities. Speech normal. No facial asymetry.   Skin: Skin is warm and dry.  Psychiatric: She has a normal mood and affect. Judgment normal.  Nursing note and vitals reviewed.   ED Course  Procedures (including critical care time) Labs Review Labs Reviewed  URINALYSIS, ROUTINE W REFLEX MICROSCOPIC - Abnormal; Notable for the following:    Color, Urine RED (*)    APPearance CLOUDY (*)    Hgb urine dipstick LARGE (*)    Protein, ur 30 (*)    Leukocytes, UA SMALL (*)    All other components within normal limits  URINE MICROSCOPIC-ADD ON - Abnormal; Notable for the following:    Squamous Epithelial / LPF MANY (*)    Bacteria, UA FEW (*)    All other components within normal limits  CBC  BASIC METABOLIC PANEL  TSH  PREGNANCY, URINE  D-DIMER, QUANTITATIVE    Imaging Review Dg Chest 2 View  12/30/2014   CLINICAL DATA:  Initial encounter for heart racing for 2 weeks. Shortness of breath.  EXAM: CHEST  2 VIEW  COMPARISON:  05/22/2012  FINDINGS: A mild pectus excavatum deformity. S-shaped thoracolumbar spine curvature. Cholecystectomy. Midline trachea. Normal heart size and mediastinal contours. No pleural effusion or pneumothorax. Clear lungs.  IMPRESSION: No acute cardiopulmonary disease.   Electronically Signed   By: Jeronimo Greaves M.D.   On: 12/30/2014 14:34  I personally reviewed the imaging tests through PACS system I reviewed available ER/hospitalization records through the EMR    EKG Interpretation   Date/Time:  Monday December 30 2014 12:15:44 EST Ventricular Rate:  91 PR Interval:  130 QRS Duration: 79 QT Interval:  339 QTC Calculation: 417 R Axis:   80 Text Interpretation:  Sinus rhythm No significant change was found  Confirmed by Akram Kissick  MD, Caryn Bee ([id number]) on 12/30/2014 12:24:36 PM      MDM   Final diagnoses:  SOB (shortness of breath)  Near syncope  Palpitations    The etiology of the patient's symptoms remain unclear at this time.  I had a long discussion  with the patient regarding ongoing workup.  The majority of this can be completed as an outpatient.  At this time I do not think the patient needs admission to the hospital or additional testing in the emergency department.  I feel as though all life-threatening causes of been ruled out at this point.  I discussed the case with her primary care physician Dr. Juanetta Gosling as well to see if he had any additional ideas and he too was not clear the etiology of her symptoms.  She is scheduled to see cardiology in 3 days.  She will benefit from tilt table testing.  I walked with the patient emergency department and we walked probably 200 yards without any difficulty.  The patient stated that she felt slightly "dizzy" but walked with a very steady gait without any ataxia.  No syncope occurred.    Lyanne Co, MD 01/01/15 [phone]

## 2015-01-02 ENCOUNTER — Telehealth: Payer: Self-pay | Admitting: *Deleted

## 2015-01-02 ENCOUNTER — Other Ambulatory Visit: Payer: Self-pay | Admitting: *Deleted

## 2015-01-02 ENCOUNTER — Encounter: Payer: Self-pay | Admitting: Cardiovascular Disease

## 2015-01-02 ENCOUNTER — Encounter: Payer: Self-pay | Admitting: *Deleted

## 2015-01-02 ENCOUNTER — Ambulatory Visit (INDEPENDENT_AMBULATORY_CARE_PROVIDER_SITE_OTHER): Payer: BC Managed Care – PPO | Admitting: Cardiovascular Disease

## 2015-01-02 VITALS — BP 100/70 | HR 83 | Ht 67.0 in | Wt 101.0 lb

## 2015-01-02 DIAGNOSIS — R42 Dizziness and giddiness: Secondary | ICD-10-CM

## 2015-01-02 DIAGNOSIS — R002 Palpitations: Secondary | ICD-10-CM

## 2015-01-02 DIAGNOSIS — R55 Syncope and collapse: Secondary | ICD-10-CM

## 2015-01-02 LAB — MAGNESIUM: MAGNESIUM: 2 mg/dL (ref 1.5–2.5)

## 2015-01-02 NOTE — Progress Notes (Signed)
Patient ID: Maria Walton J Aquilar, female   DOB: 09/05/1986, 29 y.o.   MRN: [id number]       CARDIOLOGY CONSULT NOTE  Patient ID: Maria Walton J Belue MRN: [id number] DOB/AGE: 09/05/1986 29 y.o.  Admit date: (Not on file) Primary Physician HAWKINS,EDWARD L, MD  Reason for Consultation: palpitations, near syncope  HPI: The patient is a 29 yr old female with no significant cardiac history who presents today for the evaluation of palpitations and near syncope. She was evaluated in the ED for the same on 1/18. TSH, CBC, d-dimer, and basic metabolic panel were all normal. Chest x-ray was also normal. ECG demonstrated normal sinus rhythm with no arrhythmic or ischemic abnormalities. She first experienced palpitations approximately 8 years ago. She reportedly saw a cardiologist and had an echocardiogram and a Holter monitor but the monitor results were never given to her. At that time it seemed everything was okay. She was told she may have had some trivial mitral regurgitation. Her problem subsided. Approximately 3 years ago she began experiencing dizziness. She denies ever having passed out. She denies any problems with her vision or hearing. She has had mild chronic headaches but denies a history of migraines. For the past 2 weeks, she has experienced a sensation of her heart racing which can occur both at rest and with exertion. 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 She was sitting down teaching yesterday and her heart began racing. She does not recall whether her heart gradually or suddenly starts racing nor whether it gradually or suddenly slows down. She had a similar experience when she was lying down on the couch this past weekend. She denies weight gain or weight loss or change in her menstrual cycles or bowel and bladder habits. She has had exertional dyspnea when her palpitations occur. She occasionally has some mild chest tightness associated with this but isn't certain if  it's due to her heart racing or anxiety accompanying this. She denies any family history of palpitations and syncope. She denies tingling or numbness in the hands and feet. She had been having some mild abdominal pain alleviated with Protonix. Her potassium was reportedly on the low end of the normal range and she has been started on potassium replacement. Orthostatics have been checked recently and she tells me her BP did not drop but her heart rate elevated when going from the supine to upright position.   Allergies  Allergen Reactions  . Ceclor [Cefaclor] Hives    Childhood allergy  . Promethazine Hcl Other (See Comments)    hyperactivity    Current Outpatient Prescriptions  Medication Sig Dispense Refill  . acetaminophen (TYLENOL) 325 MG tablet Take 650 mg by mouth as needed. For pain    . ibuprofen (ADVIL,MOTRIN) 200 MG tablet Take 200 mg by mouth as needed for headache.     . Multiple Vitamin (MULTIVITAMIN) tablet Take 1 tablet by mouth daily.    . pantoprazole (PROTONIX) 40 MG tablet Take 40 mg by mouth daily.     . potassium chloride 20 MEQ/15ML (10%) SOLN Take 20 mEq by mouth daily.     No current facility-administered medications for this visit.    Past Medical History  Diagnosis Date  . Heart palpitations   . Dizziness     Past Surgical History  Procedure Laterality Date  . Appendectomy      dr Lovell Sheehanjenkins 1999  . Cholecystectomy  06/05/2012    Procedure: LAPAROSCOPIC CHOLECYSTECTOMY;  Surgeon: Chrissie NoaWilliam  Daisy Blossom, MD;  Location: [facility];  Service: General;  Laterality: N/A;    History   Social History  . Marital Status: Married    Spouse Name: N/A    Number of Children: N/A  . Years of Education: N/A   Occupational History  . Not on file.   Social History Main Topics  . Smoking status: Never Smoker   . Smokeless tobacco: Never Used  . Alcohol Use: No  . Drug Use: No  . Sexual Activity: Yes    Birth Control/ Protection: None   Other Topics Concern  . Not  on file   Social History Narrative     No family history of premature CAD in 1st degree relatives.  Prior to Admission medications   Medication Sig Start Date End Date Taking? Authorizing Provider  acetaminophen (TYLENOL) 325 MG tablet Take 650 mg by mouth as needed. For pain   Yes Historical Provider, MD  ibuprofen (ADVIL,MOTRIN) 200 MG tablet Take 200 mg by mouth as needed for headache.    Yes Historical Provider, MD  Multiple Vitamin (MULTIVITAMIN) tablet Take 1 tablet by mouth daily.   Yes Historical Provider, MD  pantoprazole (PROTONIX) 40 MG tablet Take 40 mg by mouth daily.    Yes Historical Provider, MD  potassium chloride 20 MEQ/15ML (10%) SOLN Take 20 mEq by mouth daily.   Yes Historical Provider, MD     Review of systems complete and found to be negative unless listed above in HPI     Physical exam Height  (1.702 m), weight 101 lb (45.813 kg), last menstrual period 12/30/2014.   BP 100/70  Pulse 83 SpO2 99%   General: NAD, thin build Neck: No JVD, no thyromegaly or thyroid nodule.  Lungs: Clear to auscultation bilaterally with normal respiratory effort. CV: Nondisplaced PMI. Regular rate and rhythm, normal S1/S2, no S3/S4, no murmur.  No peripheral edema.  No carotid bruit.  Normal pedal pulses.  Abdomen: Soft, nontender, no hepatosplenomegaly, no distention.  Skin: Intact without lesions or rashes.  Neurologic: Alert and oriented x 3.  Psych: Normal affect. Extremities: No clubbing or cyanosis.  HEENT: Normal.   ECG: Most recent ECG reviewed.  Labs:   Lab Results  Component Value Date   WBC 5.4 12/30/2014   HGB 13.1 12/30/2014   HCT 39.1 12/30/2014   MCV 93.8 12/30/2014   PLT 293 12/30/2014    Recent Labs Lab 12/30/14 1241  NA 140  K 3.6  CL 102  CO2 30  BUN 11  CREATININE 0.60  CALCIUM 9.4  GLUCOSE 83   No results found for: CKTOTAL, CKMB, CKMBINDEX, TROPONINI No results found for: CHOL No results found for: HDL No results found  for: LDLCALC No results found for: TRIG No results found for: CHOLHDL No results found for: LDLDIRECT       Studies: No results found.  ASSESSMENT AND PLAN:  1. Palpitations and dizziness: The differential diagnosis includes an inappropriate sinus tachycardia, SVT (perhaps AVNRT), and postural tachycardia syndrome or a variant thereof. I have encouraged her to increase her salt and fluid intake, water in particular. I will obtain a 30 day event monitor to evaluate for any potential arrhythmias. I do not feel echocardiography is warranted at the present time. I may consider a tilt table study in the future. I will check a serum magnesium level.  Dispo: f/u 6 weeks.  Signed: Prentice Docker, M.D., F.A.C.C.  01/02/2015, 8:36 AM

## 2015-01-02 NOTE — Telephone Encounter (Signed)
-----   Message from Laqueta LindenSuresh A Koneswaran, MD sent at 01/02/2015  4:41 PM EST ----- Please tell patient it is normal.

## 2015-01-02 NOTE — Patient Instructions (Addendum)
Your physician recommends that you schedule a follow-up appointment in: 6 weeks. Your physician recommends that you continue on your current medications as directed. Please refer to the Current Medication list given to you today. Your physician has recommended that you wear a 30 day event monitor. Event monitors are medical devices that record the heart's electrical activity. Doctors most often us these monitors to diagnose arrhythmias. Arrhythmias are problems with the speed or rhythm of the heartbeat. The monitor is a small, portable device. You can wear one while you do your normal daily activities. This is usually used to diagnose what is causing palpitations/syncope (passing out). Please increase sodium and fluid intake.

## 2015-01-02 NOTE — Telephone Encounter (Signed)
Pt made aware, forwarded to Dr. Hawkins 

## 2015-01-07 DIAGNOSIS — R002 Palpitations: Secondary | ICD-10-CM | POA: Diagnosis not present

## 2015-01-14 ENCOUNTER — Ambulatory Visit: Payer: 59 | Admitting: Gastroenterology

## 2015-02-05 ENCOUNTER — Encounter: Payer: Self-pay | Admitting: Internal Medicine

## 2015-02-05 ENCOUNTER — Ambulatory Visit (INDEPENDENT_AMBULATORY_CARE_PROVIDER_SITE_OTHER): Payer: BC Managed Care – PPO | Admitting: Internal Medicine

## 2015-02-05 VITALS — BP 106/60 | HR 80 | Ht 67.0 in | Wt 101.4 lb

## 2015-02-05 DIAGNOSIS — G909 Disorder of the autonomic nervous system, unspecified: Secondary | ICD-10-CM

## 2015-02-05 DIAGNOSIS — R1013 Epigastric pain: Secondary | ICD-10-CM

## 2015-02-05 DIAGNOSIS — R42 Dizziness and giddiness: Secondary | ICD-10-CM

## 2015-02-05 DIAGNOSIS — R109 Unspecified abdominal pain: Secondary | ICD-10-CM | POA: Insufficient documentation

## 2015-02-05 NOTE — Patient Instructions (Signed)
Your physician recommends that you schedule a follow-up appointment in: 6-8 weeks with Dr Ladona Ridgelaylor       Thank you for choosing [facility] !

## 2015-02-05 NOTE — Assessment & Plan Note (Signed)
The etiology of her symptoms is almost certainly autonomic dysfunction. She has avoided passing out because she always lies down quickly when she feels a spell coming on. I have discussed the importance of avoiding caffeine and ETOH, not missing any meals, eating as much salt as possible and remaining well hydrated. If and when she has recurrent symptoms, she is to lie down quickly. Hopefully, all of the above will control her symptoms. If not, then we would consider florinef or a centrally acting agent.

## 2015-02-05 NOTE — Progress Notes (Signed)
      HPI [name] is referred today for evaluation of recurrent episodes of near syncope and to consider head up tilt table testing. The patient has had good health but over the past few months has noticed recurrent episodes where she feels like she is going to pass out. She lies down and the spells eventually pass. She feels poorly for an hour or two. No frank syncope. She has associated cold, clammy sensations and has not lost bowel or bladder continence or bitten her tongue. No obvious exacerbating factors. She does admit to being under stress and also admits to missing meals occaisionaly. At the start of the episodes, she notices the sensation that her heart is beating hard and fast. Allergies  Allergen Reactions  . Ceclor [Cefaclor] Hives    Childhood allergy  . Promethazine Hcl Other (See Comments)    hyperactivity     Current Outpatient Prescriptions  Medication Sig Dispense Refill  . acetaminophen (TYLENOL) 325 MG tablet Take 650 mg by mouth as needed. For pain    . ibuprofen (ADVIL,MOTRIN) 200 MG tablet Take 200 mg by mouth as needed for headache.     . Multiple Vitamin (MULTIVITAMIN) tablet Take 1 tablet by mouth daily.    . potassium chloride 20 MEQ/15ML (10%) SOLN Take 20 mEq by mouth daily.     No current facility-administered medications for this visit.     Past Medical History  Diagnosis Date  . Heart palpitations   . Dizziness     ROS:   All systems reviewed and negative except as noted in the HPI.   Past Surgical History  Procedure Laterality Date  . Appendectomy      dr Lovell Sheehanjenkins 1999  . Cholecystectomy  06/05/2012    Procedure: LAPAROSCOPIC CHOLECYSTECTOMY;  Surgeon: Marlane HatcherWilliam S Bradford, MD;  Location: [facility];  Service: General;  Laterality: N/A;     Family History  Problem Relation Age of Onset  . Hypertension Father   . Diabetes Maternal Grandmother   . Heart disease Maternal Grandmother   . Hypertension Paternal Grandfather   . Breast cancer  Paternal Grandmother [id number]     History   Social History  . Marital Status: Married    Spouse Name: N/A  . Number of Children: N/A  . Years of Education: N/A   Occupational History  . Not on file.   Social History Main Topics  . Smoking status: Never Smoker   . Smokeless tobacco: Never Used  . Alcohol Use: No  . Drug Use: No  . Sexual Activity: Yes    Birth Control/ Protection: None   Other Topics Concern  . Not on file   Social History Narrative     BP 106/60 mmHg  Pulse 80  Ht 5\' 7"  (1.702 m)  Wt 101 lb 6.4 oz (45.995 kg)  BMI 15.88 kg/m2  Physical Exam:  Well appearing young woman, NAD HEENT: Unremarkable Neck:  No JVD, no thyromegally Back:  No CVA tenderness Lungs:  Clear with no wheezes, rales, or rhonchi. HEART:  Regular rate rhythm, no murmurs, no rubs, no clicks Abd:  soft, positive bowel sounds, no organomegally, no rebound, no guarding Ext:  2 plus pulses, no edema, no cyanosis, no clubbing Skin:  No rashes no nodules Neuro:  CN II through XII intact, motor grossly intact  EKG - NSR with no pre-excitation.  Assess/Plan:

## 2015-02-05 NOTE — Assessment & Plan Note (Signed)
She is s/p cholecystectomy. Her pain is mostly controlled.

## 2015-02-05 NOTE — Assessment & Plan Note (Signed)
The patient's history is most consistent with this diagnosis. Recommendations for treatment have been made. See above. I'll see her back in 6-8 weeks.

## 2015-02-18 ENCOUNTER — Ambulatory Visit: Payer: BC Managed Care – PPO | Admitting: Cardiovascular Disease

## 2015-02-24 ENCOUNTER — Encounter: Payer: Self-pay | Admitting: *Deleted

## 2015-02-25 ENCOUNTER — Telehealth: Payer: Self-pay | Admitting: Cardiovascular Disease

## 2015-02-25 NOTE — Telephone Encounter (Signed)
[name] called the office today asking to speak to Maria Walton in regards to a letter that was mailed to patient. [facility] asking her to call the patient back.

## 2015-02-27 NOTE — Telephone Encounter (Signed)
Left message to return call 

## 2015-02-28 NOTE — Telephone Encounter (Signed)
Attempted to return call - mail box full, could not leave message.

## 2015-02-28 NOTE — Telephone Encounter (Signed)
Discussed below with patient.  Stated that she still was having symptoms & would prefer to have follow up with Dr. Purvis SheffieldKoneswaran on her monitor.  Patient stated that Dr. Ladona Ridgelaylor felt like she did not need to see Dr. Kirtland BouchardK for follow up, but she would feel more comfortable discussing with him in office.    OV scheduled for 03/25/2015 at 9:20.

## 2015-03-06 ENCOUNTER — Encounter: Payer: Self-pay | Admitting: Gynecology

## 2015-03-06 ENCOUNTER — Other Ambulatory Visit (HOSPITAL_COMMUNITY)
Admission: RE | Admit: 2015-03-06 | Discharge: 2015-03-06 | Disposition: A | Discharge status: Home / Self Care | Payer: BC Managed Care – PPO | Source: Ambulatory Visit | Attending: Gynecology | Admitting: Gynecology

## 2015-03-06 ENCOUNTER — Ambulatory Visit (INDEPENDENT_AMBULATORY_CARE_PROVIDER_SITE_OTHER): Payer: BC Managed Care – PPO | Admitting: Gynecology

## 2015-03-06 VITALS — BP 110/70 | Ht 67.0 in | Wt 102.0 lb

## 2015-03-06 DIAGNOSIS — Z01419 Encounter for gynecological examination (general) (routine) without abnormal findings: Secondary | ICD-10-CM | POA: Diagnosis not present

## 2015-03-06 NOTE — Addendum Note (Signed)
Addended by: Kem ParkinsonBARNES, Harriett Azar on: 03/06/2015 04:04 PM   Modules accepted: Orders

## 2015-03-06 NOTE — Progress Notes (Signed)
Maria Walton 07/16/1986 [id number]        29 y.o.  G0P0 for annual exam.  Several issues noted below.  Past medical history,surgical history, problem list, medications, allergies, family history and social history were all reviewed and documented as reviewed in the EPIC chart.  ROS:  Performed with pertinent positives and negatives included in the history, assessment and plan.   Additional significant findings :  none   Exam: [name] Vitals:   03/06/15 1530  BP: 110/70  Height: 5' 7"  (1.702 m)  Weight: 102 lb (46.267 kg)   General appearance:  Normal affect, orientation and appearance. Skin: Grossly normal HEENT: Without gross lesions.  No cervical or supraclavicular adenopathy. Thyroid normal.  Lungs:  Clear without wheezing, rales or rhonchi Cardiac: RR, without RMG Abdominal:  Soft, nontender, without masses, guarding, rebound, organomegaly or hernia Breasts:  Examined lying and sitting without masses, retractions, discharge or axillary adenopathy. Pelvic:  Ext/BUS/vagina normal  Cervix normal. Pap smear done  Uterus anteverted, normal size, shape and contour, midline and mobile nontender   Adnexa  Without masses or tenderness    Anus and perineum  Normal   Rectovaginal  Normal sphincter tone without palpated masses or tenderness.    Assessment/Plan:  29 y.o. G0P0 female for annual exam with regular menses, no contraception.   1. Pregnancy trial. Patient has not used contraception for over a year. She is not actively trying to get pregnant but not preventing. Has monthly regular menses with moliminal symptoms. Does note that her menses are getting worse over the past 6 months or so where they're more painful and heavier. Possibilities for endometriosis were reviewed. No family history of endometriosis. Recommend ovulation predictor kit and timed intercourse over the next several months. Follow up if not pregnant within 6 months. Workup to include  HSG/chromopertubation, semen analysis, diagnostic laparoscopy given history to rule out endometriosis all reviewed. Recent TSH at her primary physician's office normal. Continue on multivitamin with folic acid daily. 2. Pap smear 2012. Pap smear done today. No history of abnormal Pap smears previously. 3. Breast health. SBE monthly reviewed. Plan mammography closer to 32 without strong family history of breast cancer. 4. Health maintenance. No routine blood work done as she has had multiple tests recently due to workup of dizziness. Follow up or ultrasound otherwise if not pregnant within 6 months.     Anastasio Auerbach MD, 3:54 PM 03/06/2015

## 2015-03-06 NOTE — Patient Instructions (Signed)
Follow up for ultrasound as scheduled.  Start on the ovulation predictor kits to time intercourse.  Follow up if you are not pregnant within the next 6 months.  You may obtain a copy of any labs that were done today by logging onto MyChart as outlined in the instructions provided with your AVS (after visit summary). The office will not call with normal lab results but certainly if there are any significant abnormalities then we will contact you.   Health Maintenance, Female A healthy lifestyle and preventative care can promote health and wellness.  Maintain regular health, dental, and eye exams.  Eat a healthy diet. Foods like vegetables, fruits, whole grains, low-fat dairy products, and lean protein foods contain the nutrients you need without too many calories. Decrease your intake of foods high in solid fats, added sugars, and salt. Get information about a proper diet from your caregiver, if necessary.  Regular physical exercise is one of the most important things you can do for your health. Most adults should get at least 150 minutes of moderate-intensity exercise (any activity that increases your heart rate and causes you to sweat) each week. In addition, most adults need muscle-strengthening exercises on 2 or more days a week.   Maintain a healthy weight. The body mass index (BMI) is a screening tool to identify possible weight problems. It provides an estimate of body fat based on height and weight. Your caregiver can help determine your BMI, and can help you achieve or maintain a healthy weight. For adults 20 years and older:  A BMI below 18.5 is considered underweight.  A BMI of 18.5 to 24.9 is normal.  A BMI of 25 to 29.9 is considered overweight.  A BMI of 30 and above is considered obese.  Maintain normal blood lipids and cholesterol by exercising and minimizing your intake of saturated fat. Eat a balanced diet with plenty of fruits and vegetables. Blood tests for lipids and  cholesterol should begin at age 66 and be repeated every 5 years. If your lipid or cholesterol levels are high, you are over 50, or you are a high risk for heart disease, you may need your cholesterol levels checked more frequently.Ongoing high lipid and cholesterol levels should be treated with medicines if diet and exercise are not effective.  If you smoke, find out from your caregiver how to quit. If you do not use tobacco, do not start.  Lung cancer screening is recommended for adults aged 53 80 years who are at high risk for developing lung cancer because of a history of smoking. Yearly low-dose computed tomography (CT) is recommended for people who have at least a 30-pack-year history of smoking and are a current smoker or have quit within the past 15 years. A pack year of smoking is smoking an average of 1 pack of cigarettes a day for 1 year (for example: 1 pack a day for 30 years or 2 packs a day for 15 years). Yearly screening should continue until the smoker has stopped smoking for at least 15 years. Yearly screening should also be stopped for people who develop a health problem that would prevent them from having lung cancer treatment.  If you are pregnant, do not drink alcohol. If you are breastfeeding, be very cautious about drinking alcohol. If you are not pregnant and choose to drink alcohol, do not exceed 1 drink per day. One drink is considered to be 12 ounces (355 mL) of beer, 5 ounces (148 mL) of wine,  or 1.5 ounces (44 mL) of liquor.  Avoid use of street drugs. Do not share needles with anyone. Ask for help if you need support or instructions about stopping the use of drugs.  High blood pressure causes heart disease and increases the risk of stroke. Blood pressure should be checked at least every 1 to 2 years. Ongoing high blood pressure should be treated with medicines, if weight loss and exercise are not effective.  If you are 4 to 28 years old, ask your caregiver if you should  take aspirin to prevent strokes.  Diabetes screening involves taking a blood sample to check your fasting blood sugar level. This should be done once every 3 years, after age 9, if you are within normal weight and without risk factors for diabetes. Testing should be considered at a younger age or be carried out more frequently if you are overweight and have at least 1 risk factor for diabetes.  Breast cancer screening is essential preventative care for women. You should practice "breast self-awareness." This means understanding the normal appearance and feel of your breasts and may include breast self-examination. Any changes detected, no matter how small, should be reported to a caregiver. Women in their 60s and 30s should have a clinical breast exam (CBE) by a caregiver as part of a regular health exam every 1 to 3 years. After age 28, women should have a CBE every year. Starting at age 76, women should consider having a mammogram (breast X-ray) every year. Women who have a family history of breast cancer should talk to their caregiver about genetic screening. Women at a high risk of breast cancer should talk to their caregiver about having an MRI and a mammogram every year.  Breast cancer gene (BRCA)-related cancer risk assessment is recommended for women who have family members with BRCA-related cancers. BRCA-related cancers include breast, ovarian, tubal, and peritoneal cancers. Having family members with these cancers may be associated with an increased risk for harmful changes (mutations) in the breast cancer genes BRCA1 and BRCA2. Results of the assessment will determine the need for genetic counseling and BRCA1 and BRCA2 testing.  The Pap test is a screening test for cervical cancer. Women should have a Pap test starting at age 10. Between ages 44 and 65, Pap tests should be repeated every 2 years. Beginning at age 27, you should have a Pap test every 3 years as long as the past 3 Pap tests have  been normal. If you had a hysterectomy for a problem that was not cancer or a condition that could lead to cancer, then you no longer need Pap tests. If you are between ages 40 and 28, and you have had normal Pap tests going back 10 years, you no longer need Pap tests. If you have had past treatment for cervical cancer or a condition that could lead to cancer, you need Pap tests and screening for cancer for at least 20 years after your treatment. If Pap tests have been discontinued, risk factors (such as a new sexual partner) need to be reassessed to determine if screening should be resumed. Some women have medical problems that increase the chance of getting cervical cancer. In these cases, your caregiver may recommend more frequent screening and Pap tests.  The human papillomavirus (HPV) test is an additional test that may be used for cervical cancer screening. The HPV test looks for the virus that can cause the cell changes on the cervix. The cells collected during the  Pap test can be tested for HPV. The HPV test could be used to screen women aged 23 years and older, and should be used in women of any age who have unclear Pap test results. After the age of 59, women should have HPV testing at the same frequency as a Pap test.  Colorectal cancer can be detected and often prevented. Most routine colorectal cancer screening begins at the age of 28 and continues through age 51. However, your caregiver may recommend screening at an earlier age if you have risk factors for colon cancer. On a yearly basis, your caregiver may provide home test kits to check for hidden blood in the stool. Use of a small camera at the end of a tube, to directly examine the colon (sigmoidoscopy or colonoscopy), can detect the earliest forms of colorectal cancer. Talk to your caregiver about this at age 55, when routine screening begins. Direct examination of the colon should be repeated every 5 to 10 years through age 25, unless early  forms of pre-cancerous polyps or small growths are found.  Hepatitis C blood testing is recommended for all people born from [birth year] through 1964 and any individual with known risks for hepatitis C.  Practice safe sex. Use condoms and avoid high-risk sexual practices to reduce the spread of sexually transmitted infections (STIs). Sexually active women aged 44 and younger should be checked for Chlamydia, which is a common sexually transmitted infection. Older women with new or multiple partners should also be tested for Chlamydia. Testing for other STIs is recommended if you are sexually active and at increased risk.  Osteoporosis is a disease in which the bones lose minerals and strength with aging. This can result in serious bone fractures. The risk of osteoporosis can be identified using a bone density scan. Women ages 76 and over and women at risk for fractures or osteoporosis should discuss screening with their caregivers. Ask your caregiver whether you should be taking a calcium supplement or vitamin D to reduce the rate of osteoporosis.  Menopause can be associated with physical symptoms and risks. Hormone replacement therapy is available to decrease symptoms and risks. You should talk to your caregiver about whether hormone replacement therapy is right for you.  Use sunscreen. Apply sunscreen liberally and repeatedly throughout the day. You should seek shade when your shadow is shorter than you. Protect yourself by wearing long sleeves, pants, a wide-brimmed hat, and sunglasses year round, whenever you are outdoors.  Notify your caregiver of new moles or changes in moles, especially if there is a change in shape or color. Also notify your caregiver if a mole is larger than the size of a pencil eraser.  Stay current with your immunizations. Document Released: 06/14/2011 Document Revised: 03/26/2013 Document Reviewed: 06/14/2011 [facility] Patient Information 2014 [place].

## 2015-03-07 LAB — URINALYSIS W MICROSCOPIC + REFLEX CULTURE
BACTERIA UA: NONE SEEN
Bilirubin Urine: NEGATIVE
CASTS: NONE SEEN
Crystals: NONE SEEN
Glucose, UA: NEGATIVE mg/dL
HGB URINE DIPSTICK: NEGATIVE
KETONES UR: NEGATIVE mg/dL
Leukocytes, UA: NEGATIVE
Nitrite: NEGATIVE
PROTEIN: NEGATIVE mg/dL
SPECIFIC GRAVITY, URINE: 1.022 (ref 1.005–1.030)
UROBILINOGEN UA: 0.2 mg/dL (ref 0.0–1.0)
pH: 8.5 — ABNORMAL HIGH (ref 5.0–8.0)

## 2015-03-11 LAB — CYTOLOGY - PAP

## 2015-03-25 ENCOUNTER — Ambulatory Visit (INDEPENDENT_AMBULATORY_CARE_PROVIDER_SITE_OTHER): Payer: BC Managed Care – PPO | Admitting: Cardiovascular Disease

## 2015-03-25 ENCOUNTER — Encounter: Payer: Self-pay | Admitting: Cardiovascular Disease

## 2015-03-25 VITALS — BP 96/61 | HR 91 | Ht 67.0 in | Wt 101.0 lb

## 2015-03-25 DIAGNOSIS — E876 Hypokalemia: Secondary | ICD-10-CM

## 2015-03-25 DIAGNOSIS — Z719 Counseling, unspecified: Secondary | ICD-10-CM | POA: Diagnosis not present

## 2015-03-25 DIAGNOSIS — R55 Syncope and collapse: Secondary | ICD-10-CM

## 2015-03-25 DIAGNOSIS — R002 Palpitations: Secondary | ICD-10-CM | POA: Diagnosis not present

## 2015-03-25 DIAGNOSIS — Z7182 Exercise counseling: Secondary | ICD-10-CM

## 2015-03-25 DIAGNOSIS — Z713 Dietary counseling and surveillance: Secondary | ICD-10-CM

## 2015-03-25 DIAGNOSIS — G909 Disorder of the autonomic nervous system, unspecified: Secondary | ICD-10-CM

## 2015-03-25 NOTE — Patient Instructions (Signed)
   [facility]  Office will contact with results via phone or letter.  Continue all current medications.  Follow up in  3-4 months

## 2015-03-25 NOTE — Progress Notes (Signed)
Patient ID: Maria Walton, female   DOB: [birth year], 29 y.o.   MRN: [id number]      SUBJECTIVE: The patient presents for follow-up of autonomic dysfunction with palpitations and dizziness. Event monitoring demonstrated sinus rhythm and sinus tachycardia with PACs and a 5 beat run of atrial tachycardia. Symptoms correlated with both sinus rhythm, sinus tachycardia, and PACs. She saw Dr. Ladona Ridgelaylor (electrophysiologist) who recommended conservative measures such as eating frequent meals, remaining adequately hydrated, increased sodium consumption, and avoiding alcohol. He said that if these measures did not work, he would then consider centrally acting agents such as Florinef.  She is feeling better with less frequent episodes of palpitations and dizziness. She had been drinking one cup of caffeinated coffee daily but now drinks only decaffeinated beverages. She tries to snack on salty foods such as pretzels. Ideally, she would prefer to avoid the use of daily medications.   Review of Systems: As per "subjective", otherwise negative.  Allergies  Allergen Reactions  . Ceclor [Cefaclor] Hives    Childhood allergy  . Promethazine Hcl Other (See Comments)    hyperactivity    Current Outpatient Prescriptions  Medication Sig Dispense Refill  . acetaminophen (TYLENOL) 325 MG tablet Take 650 mg by mouth as needed. For pain    . ibuprofen (ADVIL,MOTRIN) 200 MG tablet Take 200 mg by mouth as needed for headache.     . Multiple Vitamin (MULTIVITAMIN) tablet Take 1 tablet by mouth daily.    . potassium chloride 20 MEQ/15ML (10%) SOLN Take 20 mEq by mouth daily.     No current facility-administered medications for this visit.    Past Medical History  Diagnosis Date  . Heart palpitations   . Dizziness     Past Surgical History  Procedure Laterality Date  . Appendectomy      dr Lovell Sheehanjenkins 1999  . Cholecystectomy  06/05/2012    Procedure: LAPAROSCOPIC CHOLECYSTECTOMY;  Surgeon: Marlane HatcherWilliam S Bradford,  MD;  Location: [facility];  Service: General;  Laterality: N/A;    History   Social History  . Marital Status: Married    Spouse Name: N/A  . Number of Children: N/A  . Years of Education: N/A   Occupational History  . Not on file.   Social History Main Topics  . Smoking status: Never Smoker   . Smokeless tobacco: Never Used  . Alcohol Use: No  . Drug Use: No  . Sexual Activity: Yes    Birth Control/ Protection: None     Comment: intercourse age 29, sexual partners less than 5   Other Topics Concern  . Not on file   Social History Narrative     Filed Vitals:   03/25/15 0911  BP: 96/61  Pulse: 91  Height: 5\' 7"  (1.702 m)  Weight: 101 lb (45.813 kg)    PHYSICAL EXAM General: NAD, thin build Neck: No JVD, no thyromegaly or thyroid nodule.  Lungs: Clear to auscultation bilaterally with normal respiratory effort. CV: Nondisplaced PMI. Regular rate and rhythm, normal S1/S2, no S3/S4, no murmur. No peripheral edema. No carotid bruit. Normal pedal pulses.  Abdomen: Soft, nontender, no hepatosplenomegaly, no distention.  Skin: Intact without lesions or rashes.  Neurologic: Alert and oriented x 3.  Psych: Normal affect. Extremities: No clubbing or cyanosis.  HEENT: Normal.   ECG: Most recent ECG reviewed.      ASSESSMENT AND PLAN: 1. Autonomic dysfunction with palpitations and dizziness: Symptoms have improved to some degree. We had a very lengthy discussion regarding continued  use of conservative measures such as increased hydration, sodium consumption, and adequate meal intake. We also talked about the use of centrally acting agent such as Florinef. For the time being, will proceed with conservative measures. Will check a basic metabolic panel to evaluate sodium level as she had been mildly hypokalemic in the past. She is in agreement with this plan.   Dispo: f/u 3-4 months.  Prentice Docker, M.D., F.A.C.C.

## 2015-03-26 ENCOUNTER — Other Ambulatory Visit: Payer: Self-pay | Admitting: Gynecology

## 2015-03-26 ENCOUNTER — Encounter: Payer: Self-pay | Admitting: Gynecology

## 2015-03-26 ENCOUNTER — Ambulatory Visit (INDEPENDENT_AMBULATORY_CARE_PROVIDER_SITE_OTHER): Payer: BC Managed Care – PPO | Admitting: Gynecology

## 2015-03-26 ENCOUNTER — Ambulatory Visit (INDEPENDENT_AMBULATORY_CARE_PROVIDER_SITE_OTHER): Payer: BC Managed Care – PPO

## 2015-03-26 VITALS — BP 110/60

## 2015-03-26 DIAGNOSIS — N838 Other noninflammatory disorders of ovary, fallopian tube and broad ligament: Secondary | ICD-10-CM

## 2015-03-26 DIAGNOSIS — N946 Dysmenorrhea, unspecified: Secondary | ICD-10-CM

## 2015-03-26 DIAGNOSIS — N83201 Unspecified ovarian cyst, right side: Secondary | ICD-10-CM

## 2015-03-26 DIAGNOSIS — N831 Corpus luteum cyst of ovary, unspecified side: Secondary | ICD-10-CM

## 2015-03-26 DIAGNOSIS — N920 Excessive and frequent menstruation with regular cycle: Secondary | ICD-10-CM

## 2015-03-26 DIAGNOSIS — N8320 Unspecified ovarian cysts: Secondary | ICD-10-CM

## 2015-03-26 DIAGNOSIS — N839 Noninflammatory disorder of ovary, fallopian tube and broad ligament, unspecified: Secondary | ICD-10-CM

## 2015-03-26 DIAGNOSIS — N7011 Chronic salpingitis: Secondary | ICD-10-CM

## 2015-03-26 DIAGNOSIS — Z01419 Encounter for gynecological examination (general) (routine) without abnormal findings: Secondary | ICD-10-CM

## 2015-03-26 NOTE — Progress Notes (Signed)
Maria Walton 08-Jun-1986 [id number]        [age] y.o.  G0P0 Presents for follow up ultrasound due to worsening dysmenorrhea and menorrhagia. Currently trying to achieve pregnancy for approximately 1 year.  Past medical history,surgical history, problem list, medications, allergies, family history and social history were all reviewed and documented in the EPIC chart.  Directed ROS with pertinent positives and negatives documented in the history of present illness/assessment and plan.  Exam: Filed Vitals:   03/26/15 1504  BP: 110/60   Ultrasound shows uterus normal size and echotexture. Endometrial echo 4.5 mm. Right ovary with thin-walled avascular cystic mass with homogeneous low-level echoes 35 x 25 x 26 mm. Left ovary normal. Left adnexa with tubular mass 26 x 17 x 19 mm fluid filled thicker wall. Negative color flow Doppler. Cul-de-sac with small amount of fluid 29 x 24 mm.  Assessment/Plan:  29 y.o. G0P0 with dysmenorrhea menorrhagia and ultrasound with questionable right endometrioma versus hemorrhagic cyst. Differential to include physiologic versus pathologic such as endometrioma, benign/malignant tumor. Options for management to include laparoscopy now recognizing she is pursuing pregnancy versus repeat ultrasound in 2 months and consider surgery if this persists. At this point the patient prefers to wait and repeat the ultrasound in 2 months. Will call back if she decides to pursue surgery sooner. She does understand the issues of potential pathology and postponing diagnosis.    Dara LordsFONTAINE,Seville Downs P MD, 3:35 PM 03/26/2015

## 2015-03-26 NOTE — Patient Instructions (Signed)
Follow up for repeat ultrasound in 2 months. Sooner if you want to pursue surgery.

## 2015-03-27 ENCOUNTER — Other Ambulatory Visit: Payer: Self-pay | Admitting: Cardiovascular Disease

## 2015-03-28 LAB — BASIC METABOLIC PANEL
BUN: 9 mg/dL (ref 6–23)
CO2: 33 mEq/L — ABNORMAL HIGH (ref 19–32)
Calcium: 9.8 mg/dL (ref 8.4–10.5)
Chloride: 101 mEq/L (ref 96–112)
Creat: 0.65 mg/dL (ref 0.50–1.10)
Glucose, Bld: 78 mg/dL (ref 70–99)
Potassium: 4 mEq/L (ref 3.5–5.3)
SODIUM: 141 meq/L (ref 135–145)

## 2015-05-26 ENCOUNTER — Ambulatory Visit (INDEPENDENT_AMBULATORY_CARE_PROVIDER_SITE_OTHER): Payer: BC Managed Care – PPO

## 2015-05-26 ENCOUNTER — Ambulatory Visit (INDEPENDENT_AMBULATORY_CARE_PROVIDER_SITE_OTHER): Payer: BC Managed Care – PPO | Admitting: Gynecology

## 2015-05-26 ENCOUNTER — Other Ambulatory Visit: Payer: Self-pay | Admitting: Gynecology

## 2015-05-26 ENCOUNTER — Encounter: Payer: Self-pay | Admitting: Gynecology

## 2015-05-26 VITALS — BP 110/60

## 2015-05-26 DIAGNOSIS — N83201 Unspecified ovarian cyst, right side: Secondary | ICD-10-CM

## 2015-05-26 DIAGNOSIS — N8320 Unspecified ovarian cysts: Secondary | ICD-10-CM | POA: Diagnosis not present

## 2015-05-26 DIAGNOSIS — N7011 Chronic salpingitis: Secondary | ICD-10-CM | POA: Diagnosis not present

## 2015-05-26 DIAGNOSIS — N946 Dysmenorrhea, unspecified: Secondary | ICD-10-CM

## 2015-05-26 DIAGNOSIS — N83202 Unspecified ovarian cyst, left side: Secondary | ICD-10-CM

## 2015-05-26 NOTE — Progress Notes (Signed)
Maria Walton [birth year] [id number]        29 y.o.  G0P0 presents for follow up ultrasound. History of worsening dysmenorrhea extending 1 week before and one week after her menses. Has been trying to get pregnant over the last 1-2 years. Ultrasound for/2016 showed right ovarian thin-walled avascular cystic mass with homogeneous low-level echoes 35 x 25 x 26 mm. Left adnexa with tubular cystic area 26 x 17 x 19 mm. Presents for follow up ultrasound now.  Past medical history,surgical history, problem list, medications, allergies, family history and social history were all reviewed and documented in the EPIC chart.  Directed ROS with pertinent positives and negatives documented in the history of present illness/assessment and plan.  Exam: Filed Vitals:   05/26/15 1109  BP: 110/60   General appearance:  Normal   Ultrasound shows uterus normal size and echotexture. Endometrium 6.8 mm tri-layer. Persistence of the right ovary thin-walled homogeneous low level echoes cyst avascular at 33 x 41 x 30 mm. Left ovary with similar appearing low level echo simple cyst thin-walled negative flow at 33 x 25 x 30 mm. Persistence of the left tubular cystic area 33 x 17 x 29 mm with negative flow.  Assessment/Plan:  29 y.o. G0P0 with persistent right ovarian cyst suggestive of endometrioma. Left ovary now with similar-appearing cyst. Persistence of a tubular cystic area on the left adnexa questionable paratubal cyst versus small hydrosalpinx. Reviewed total picture with the patient and my high suspicion for endometriosis/endometrioma. Options for management reviewed to include laparoscopic evaluation, removal of endometriomas if encountered and treatment of endometriosis as possible laparoscopically. Assessment of the left adnexa. Issues if it is a hydrosalpinx about trying to do a salpingostomy versus leaving it alone recognizing the poor outcome with salpingostomy and increased risk of ectopic pregnancy. Options  of referral to reproductive endocrinologist regardless either postop or preop also discussed. If endometriomas significant negative impact as far as fertility discussed. Patient very reluctant to consider surgery at this time. She understands that her pregnancy success rate may be very low without this but prefers no surgery at this time. I recommended at least repeat ultrasound in 3-4 months to relook at her ovaries. I also offered referral to a reproductive endocrinologist to review her situation and name and numbers provided for Dr. April Manson. Patient wants to discuss with her husband and will follow up as a choose but at least we'll repeat the ultrasound in 3-4 months. Disclaimer that I cannot guarantee pathology as far as benign versus malignant if no surgery elected.    Dara Lords MD, 11:40 AM 05/26/2015

## 2015-05-26 NOTE — Patient Instructions (Signed)
Dr. April Manson is a reproductive endocrinologist who specializes in infertility. His phone number is [phone] if you desire to set up an appointment with him.  Call me if you decide to pursue surgery.  Follow up for repeat ultrasound in 3-4 months.

## 2015-08-01 ENCOUNTER — Encounter: Payer: Self-pay | Admitting: Cardiovascular Disease

## 2015-08-01 ENCOUNTER — Ambulatory Visit (INDEPENDENT_AMBULATORY_CARE_PROVIDER_SITE_OTHER): Payer: BC Managed Care – PPO | Admitting: Cardiovascular Disease

## 2015-08-01 VITALS — BP 98/62 | HR 86 | Ht 67.0 in | Wt 109.0 lb

## 2015-08-01 DIAGNOSIS — I9589 Other hypotension: Secondary | ICD-10-CM

## 2015-08-01 DIAGNOSIS — G909 Disorder of the autonomic nervous system, unspecified: Secondary | ICD-10-CM

## 2015-08-01 DIAGNOSIS — R55 Syncope and collapse: Secondary | ICD-10-CM

## 2015-08-01 DIAGNOSIS — R519 Headache, unspecified: Secondary | ICD-10-CM

## 2015-08-01 DIAGNOSIS — R51 Headache: Secondary | ICD-10-CM | POA: Diagnosis not present

## 2015-08-01 DIAGNOSIS — R42 Dizziness and giddiness: Secondary | ICD-10-CM

## 2015-08-01 NOTE — Patient Instructions (Addendum)
Blood pressure monitor - 24 hour - [employer].  Office will contact with results via phone or letter.    Continue all current medications. Thigh high compression stockings - order provided today.  Follow up in  3 months

## 2015-08-01 NOTE — Progress Notes (Signed)
Patient ID: Maria Walton, female   DOB: May 03, 1986, 29 y.o.   MRN: [id number]      SUBJECTIVE: The patient presents for follow-up of autonomic dysfunction with palpitations and dizziness. Prior event monitoring demonstrated sinus rhythm and sinus tachycardia with PACs and a 5 beat run of atrial tachycardia. Symptoms correlated with both sinus rhythm, sinus tachycardia, and PACs. She subsequently saw Dr. Ladona Ridgel (electrophysiologist) who recommended conservative measures such as eating frequent meals, remaining adequately hydrated, increased sodium consumption, and avoiding alcohol. He said that if these measures did not work, he would then consider centrally acting agents such as Florinef.  While she has not been bothered by palpitations as frequently, she has struggled with episodic dizziness with headaches causing her to pull over while driving at times. She has tried eating more protein and eating more frequently with 3 meals a day and snacks in between but this has not helped alleviate her symptoms. She denies syncope but has had near syncopal episodes. She denies chest pain. She occasionally has mild exertional shortness of breath. She denies leg swelling. She tries to stay adequately hydrated but is unable to spend a lot of times outdoors with the high temperatures and humidity.  She is here with her mother.   Review of Systems: As per "subjective", otherwise negative.  Allergies  Allergen Reactions  . Ceclor [Cefaclor] Hives    Childhood allergy  . Promethazine Hcl Other (See Comments)    hyperactivity  . Latex Rash    Rash when using latex gloves    Current Outpatient Prescriptions  Medication Sig Dispense Refill  . acetaminophen (TYLENOL) 325 MG tablet Take 650 mg by mouth as needed. For pain    . ibuprofen (ADVIL,MOTRIN) 200 MG tablet Take 200 mg by mouth as needed for headache.     . Multiple Vitamin (MULTIVITAMIN) tablet Take 1 tablet by mouth daily.     No current  facility-administered medications for this visit.    Past Medical History  Diagnosis Date  . Heart palpitations   . Dizziness     Past Surgical History  Procedure Laterality Date  . Appendectomy      dr Lovell Sheehan 1999  . Cholecystectomy  06/05/2012    Procedure: LAPAROSCOPIC CHOLECYSTECTOMY;  Surgeon: Marlane Hatcher, MD;  Location: [facility];  Service: General;  Laterality: N/A;    Social History   Social History  . Marital Status: Married    Spouse Name: N/A  . Number of Children: N/A  . Years of Education: N/A   Occupational History  . Not on file.   Social History Main Topics  . Smoking status: Never Smoker   . Smokeless tobacco: Never Used  . Alcohol Use: No  . Drug Use: No  . Sexual Activity: Yes    Birth Control/ Protection: None     Comment: intercourse age 55, sexual partners less than 5   Other Topics Concern  . Not on file   Social History Narrative     Filed Vitals:   08/01/15 0915  BP: 98/62  Pulse: 86  Height:  (1.702 m)  Weight: 109 lb (49.442 kg)  SpO2: 99%    PHYSICAL EXAM General: NAD, thin build Neck: No JVD, no thyromegaly or thyroid nodule.  Lungs: Clear to auscultation bilaterally with normal respiratory effort. CV: Nondisplaced PMI. Regular rate and rhythm, normal S1/S2, no S3/S4, no murmur. No peripheral edema. No carotid bruit. Normal pedal pulses.  Abdomen: Soft, nontender, no hepatosplenomegaly, no distention.  Skin: Intact without lesions or rashes.  Neurologic: Alert and oriented x 3.  Psych: Normal affect. Extremities: No clubbing or cyanosis.  HEENT: Normal.   ECG: Most recent ECG reviewed.    ASSESSMENT AND PLAN: 1. Autonomic dysfunction with palpitations, headaches, and dizziness: Symptoms have progressed in spite of several conservative measures. Will obtain ambulatory BP monitoring to correlate symptoms with probable hypotension. Will prescribe thigh high compression stockings 20-30 mmHg. Will likely  initiate Florinef in the near future.   Dispo: f/u 3 months.   Prentice Docker, M.D., F.A.C.C.

## 2015-08-06 ENCOUNTER — Encounter: Payer: Self-pay | Admitting: *Deleted

## 2015-08-06 ENCOUNTER — Ambulatory Visit (INDEPENDENT_AMBULATORY_CARE_PROVIDER_SITE_OTHER): Payer: BC Managed Care – PPO

## 2015-08-06 DIAGNOSIS — I9589 Other hypotension: Secondary | ICD-10-CM

## 2015-08-06 DIAGNOSIS — G909 Disorder of the autonomic nervous system, unspecified: Secondary | ICD-10-CM

## 2015-08-06 NOTE — Progress Notes (Signed)
Patient ID: Maria Walton, female   DOB: [birth year], 29 y.o.   MRN: [id number] 24 hour ambulatory blood pressure monitor applied to patient.

## 2015-08-19 ENCOUNTER — Telehealth: Payer: Self-pay | Admitting: *Deleted

## 2015-08-19 NOTE — Telephone Encounter (Signed)
Per Dr. Purvis Sheffield - BP 103/60, average HR 80bpm.  No significant hypotension.    Patient notified.  Follow up already scheduled for 10/2015 with Dr. Purvis Sheffield.

## 2015-08-27 ENCOUNTER — Telehealth: Payer: Self-pay | Admitting: *Deleted

## 2015-08-27 ENCOUNTER — Ambulatory Visit (INDEPENDENT_AMBULATORY_CARE_PROVIDER_SITE_OTHER): Payer: BC Managed Care – PPO

## 2015-08-27 ENCOUNTER — Ambulatory Visit (INDEPENDENT_AMBULATORY_CARE_PROVIDER_SITE_OTHER): Payer: BC Managed Care – PPO | Admitting: Gynecology

## 2015-08-27 ENCOUNTER — Encounter: Payer: Self-pay | Admitting: Gynecology

## 2015-08-27 VITALS — BP 112/64

## 2015-08-27 DIAGNOSIS — N8320 Unspecified ovarian cysts: Secondary | ICD-10-CM

## 2015-08-27 DIAGNOSIS — N83201 Unspecified ovarian cyst, right side: Secondary | ICD-10-CM

## 2015-08-27 DIAGNOSIS — N83202 Unspecified ovarian cyst, left side: Principal | ICD-10-CM

## 2015-08-27 NOTE — Telephone Encounter (Signed)
Patient needs referral for Dr.Yalcinkaya office per note on 08/27/15, referral will be sent they will contact pt to schedule.

## 2015-08-27 NOTE — Patient Instructions (Signed)
Follow up with Dr. April Manson as we discussed. Follow up with me if you want to proceed with surgery

## 2015-08-27 NOTE — Progress Notes (Signed)
Maria Walton 03/11/1986 [id number]        29 y.o.  G0P0 Presents for follow up ultrasound.  History of persistent bilateral ovarian cysts suggestive of endometriomas and questionable small left hydrosalpinx. I discussed surgical possibilities or referral to Dr. April Manson in the past but she had elected to monitor and follow up for ultrasound now. Her menses continue to be very painful starting the week before and continuing through the week after her menses.  Past medical history,surgical history, problem list, medications, allergies, family history and social history were all reviewed and documented in the EPIC chart.  Directed ROS with pertinent positives and negatives documented in the history of present illness/assessment and plan.  Filed Vitals:   08/27/15 0959  BP: 112/64   General appearance:  Normal  Ultrasound shows uterus normal size and echotexture. Endometrial echo 15 mm (day 26) right ovary with thin-walled diffuse low-level echo cyst 38 mm mean. Negative color flow Doppler.  Left ovary with low level diffuse echo cyst 25 x 16 mm.  Negative color flow Doppler. Cul-de-sac negative. Cystic mass 27 x 19 x 22 mm left adnexa  Assessment/Plan:  29 y.o. G0P0 with persistence of bilateral ovarian cysts suggestive of endometriomas. This along with her history strongly suggests endometriosis. Continue to attempt pregnancy. Reviewed the issues and options to include laparoscopy with removal of the cysts, addressing any endometriosis encountered in assessing the left adnexa for hydrosalpinx. The issues if hydrosalpinx encountered whether to trial tuboplasty recognizing the risk of ectopic pregnancy versus leaving it in situ or performing a salpingectomy in anticipation of in vitro fertilization. Patient was offered consult with reproductive endocrinologist previously and she wants to go ahead with this now. She may want to consider in vitro fertilization and we can proceed this with endometriosis  debulking and possible salpingectomy pending reproductive endocrinologist recommendation. Patient has the names and numbers for Dr. April Manson will call and make appointment to see him.    Dara Lords MD, 10:21 AM 08/27/2015

## 2015-09-09 NOTE — Telephone Encounter (Signed)
Appointment on 09/23/15.

## 2015-10-17 ENCOUNTER — Encounter: Payer: Self-pay | Admitting: *Deleted

## 2015-10-17 ENCOUNTER — Emergency Department: Payer: BC Managed Care – PPO

## 2015-10-17 ENCOUNTER — Emergency Department
Admission: EM | Admit: 2015-10-17 | Discharge: 2015-10-17 | Disposition: A | Discharge status: Home / Self Care | Payer: BC Managed Care – PPO | Attending: Emergency Medicine | Admitting: Emergency Medicine

## 2015-10-17 DIAGNOSIS — Z3202 Encounter for pregnancy test, result negative: Secondary | ICD-10-CM | POA: Insufficient documentation

## 2015-10-17 DIAGNOSIS — N83202 Unspecified ovarian cyst, left side: Secondary | ICD-10-CM | POA: Diagnosis not present

## 2015-10-17 DIAGNOSIS — Z79899 Other long term (current) drug therapy: Secondary | ICD-10-CM | POA: Diagnosis not present

## 2015-10-17 DIAGNOSIS — N83201 Unspecified ovarian cyst, right side: Secondary | ICD-10-CM

## 2015-10-17 DIAGNOSIS — R1030 Lower abdominal pain, unspecified: Secondary | ICD-10-CM | POA: Diagnosis present

## 2015-10-17 DIAGNOSIS — Z91040 Latex allergy status: Secondary | ICD-10-CM | POA: Insufficient documentation

## 2015-10-17 DIAGNOSIS — R102 Pelvic and perineal pain: Secondary | ICD-10-CM

## 2015-10-17 LAB — COMPREHENSIVE METABOLIC PANEL
ALBUMIN: 4.4 g/dL (ref 3.5–5.0)
ALK PHOS: 43 U/L (ref 38–126)
ALT: 10 U/L — AB (ref 14–54)
AST: 17 U/L (ref 15–41)
Anion gap: 7 (ref 5–15)
BILIRUBIN TOTAL: 1 mg/dL (ref 0.3–1.2)
BUN: 8 mg/dL (ref 6–20)
CALCIUM: 9.1 mg/dL (ref 8.9–10.3)
CO2: 28 mmol/L (ref 22–32)
CREATININE: 0.59 mg/dL (ref 0.44–1.00)
Chloride: 107 mmol/L (ref 101–111)
GFR calc Af Amer: >60 mL/min (ref >60)
GFR calc non Af Amer: >60 mL/min (ref >60)
GLUCOSE: 133 mg/dL — AB (ref 65–99)
POTASSIUM: 3.7 mmol/L (ref 3.5–5.1)
Sodium: 142 mmol/L (ref 135–145)
TOTAL PROTEIN: 7.4 g/dL (ref 6.5–8.1)

## 2015-10-17 LAB — URINALYSIS COMPLETE WITH MICROSCOPIC (ARMC ONLY)
BACTERIA UA: NONE SEEN
Bilirubin Urine: NEGATIVE
GLUCOSE, UA: NEGATIVE mg/dL
Hgb urine dipstick: NEGATIVE
Leukocytes, UA: NEGATIVE
NITRITE: NEGATIVE
Protein, ur: NEGATIVE mg/dL
SPECIFIC GRAVITY, URINE: 1.024 (ref 1.005–1.030)
pH: 6 (ref 5.0–8.0)

## 2015-10-17 LAB — CBC
HEMATOCRIT: 38.7 % (ref 35.0–47.0)
Hemoglobin: 13.1 g/dL (ref 12.0–16.0)
MCH: 30.8 pg (ref 26.0–34.0)
MCHC: 33.9 g/dL (ref 32.0–36.0)
MCV: 90.9 fL (ref 80.0–100.0)
Platelets: 345 10*3/uL (ref 150–440)
RBC: 4.26 MIL/uL (ref 3.80–5.20)
RDW: 13 % (ref 11.5–14.5)
WBC: 7.2 10*3/uL (ref 3.6–11.0)

## 2015-10-17 LAB — WET PREP, GENITAL
CLUE CELLS WET PREP: NONE SEEN
Trich, Wet Prep: NONE SEEN
Yeast Wet Prep HPF POC: NONE SEEN

## 2015-10-17 LAB — CHLAMYDIA/NGC RT PCR (ARMC ONLY)
Chlamydia Tr: NOT DETECTED
N gonorrhoeae: NOT DETECTED

## 2015-10-17 LAB — HCG, QUANTITATIVE, PREGNANCY: HCG, BETA CHAIN, QUANT, S: 1 m[IU]/mL (ref <5)

## 2015-10-17 LAB — LIPASE, BLOOD: Lipase: 28 U/L (ref 11–51)

## 2015-10-17 MED ORDER — FENTANYL CITRATE (PF) 100 MCG/2ML IJ SOLN: 50.0000 ug | Freq: Once | INTRAMUSCULAR | Status: DC

## 2015-10-17 MED ORDER — ONDANSETRON 4 MG PO TBDP
4.0000 mg | ORAL_TABLET | Freq: Once | ORAL | Status: AC
  Administered 2015-10-17: 4 mg via ORAL

## 2015-10-17 MED ORDER — ACETAMINOPHEN 160 MG/5ML PO SUSP
ORAL | Status: AC
  Filled 2015-10-17: qty 10

## 2015-10-17 MED ORDER — ONDANSETRON 4 MG PO TBDP
ORAL_TABLET | ORAL | Status: AC
  Administered 2015-10-17: 4 mg via ORAL
  Filled 2015-10-17: qty 1

## 2015-10-17 MED ORDER — ONDANSETRON HCL 4 MG/2ML IJ SOLN
4.0000 mg | Freq: Once | INTRAMUSCULAR | Status: AC
Start: 2015-10-17 — End: 2015-10-17
  Administered 2015-10-17: 4 mg via INTRAVENOUS

## 2015-10-17 MED ORDER — ONDANSETRON HCL 4 MG/2ML IJ SOLN
INTRAMUSCULAR | Status: AC
  Filled 2015-10-17: qty 2

## 2015-10-17 MED ORDER — AZITHROMYCIN 1 G PO PACK
2.0000 g | PACK | Freq: Once | ORAL | Status: AC
  Administered 2015-10-17: 2 g via ORAL
  Filled 2015-10-17: qty 2

## 2015-10-17 MED ORDER — LEVOFLOXACIN 500 MG PO TABS: 500.0000 mg | ORAL_TABLET | Freq: Every day | ORAL | Status: DC

## 2015-10-17 MED ORDER — AZITHROMYCIN 250 MG PO TABS
2000.0000 mg | ORAL_TABLET | Freq: Once | ORAL | Status: DC
  Filled 2015-10-17: qty 8

## 2015-10-17 NOTE — ED Provider Notes (Signed)
-----------------------------------------   6:45 PM on 10/17/2015 -----------------------------------------  Care was assumed from Dr. Derrill KayGoodman at approximately 4 PM pending pelvic ultrasound. Ultrasound negative for torsion. Collapsing complex cyst in the right ovary as well as decrease in size of the hemorrhagic cyst in the left ovary. She also has a stable left paraovarian cysts. No pelvic fluid to suggest recent cyst rupture. At this time the patient's pain is very well controlled, she is sitting up in bed, tolerating by mouth intake. On Dr. Robbi GarterGoodman's pelvic exam, she did have some cervical motion and adnexal tenderness to palpation however the patient has no history of sexually transmitted infection, is in a mutually monogamous sexual relationship with her husband for many years and does not want to take antibiotic therapy for possible pelvic inflammatory disease given minimal concern for sexual treatment infection. GC chlamydia still pending. Wet prep is unremarkable. She is a Engineer, civil (consulting)nurse, very well-informed, has capacity to make this decision. We discussed meticulous return precautions, need for close GYN follow-up as well as follow-up with her reproductive endocrinologist who has been treating her. DC with return precautions. She is comfortable with the discharge plan.  Gayla DossEryka A Georgann Bramble, MD 10/17/15 [phone]

## 2015-10-17 NOTE — ED Notes (Signed)
MD at bedside. 

## 2015-10-17 NOTE — ED Notes (Signed)
Pt reports sudden onset of lower abdominal pain 30 minutes ago with nausea

## 2015-10-17 NOTE — ED Notes (Signed)
Patient transported to Ultrasound 

## 2015-10-17 NOTE — ED Notes (Signed)
Pt offered pain meds while waiting in subwait, pt refused pain meds at this time. C/o 5/10 abd pain. Pt awaiting room.

## 2015-10-17 NOTE — ED Notes (Signed)
Pt unable to give urine sample at this time 

## 2015-10-17 NOTE — ED Provider Notes (Signed)
[facility] Emergency Department Provider Note   ____________________________________________  Time seen: 1415  I have reviewed the triage vital signs and the nursing notes.   HISTORY  Chief Complaint Abdominal Pain   History limited by: Not Limited   HPI Maria Walton is a 29 y.o. female who presents to the emergency department today because of concerns for pelvic pain. She states that it started suddenly when she was driving to work. She describes the pain as being located bilaterally in the suprapubic and the pelvis. She describes it as a twisting type pain. It occurred suddenly. It has gradually improved. She states that she has recently undergone pelvic ultrasound to investigate for cysts versus endometriosis. She states that the last vomitus that it was endometriosis. She denies any recent fevers. Denies any change in defecation or urination.   Past Medical History  Diagnosis Date  . Heart palpitations   . Dizziness     Patient Active Problem List   Diagnosis Date Noted  . Other specified hypotension 08/06/2015  . Abdominal pain 02/05/2015  . Autonomic dysfunction 02/05/2015  . Dizziness   . NWGNFAOZ(308.6Headache(784.0)     Past Surgical History  Procedure Laterality Date  . Appendectomy      dr Lovell Sheehanjenkins 1999  . Cholecystectomy  06/05/2012    Procedure: LAPAROSCOPIC CHOLECYSTECTOMY;  Surgeon: Marlane HatcherWilliam S Bradford, MD;  Location: [facility];  Service: General;  Laterality: N/A;    Current Outpatient Rx  Name  Route  Sig  Dispense  Refill  . acetaminophen (TYLENOL) 325 MG tablet   Oral   Take 650 mg by mouth as needed. For pain         . ibuprofen (ADVIL,MOTRIN) 200 MG tablet   Oral   Take 200 mg by mouth as needed for headache.          . Multiple Vitamin (MULTIVITAMIN) tablet   Oral   Take 1 tablet by mouth daily.           Allergies Ceclor; Promethazine hcl; and Latex  Family History  Problem Relation Age of Onset  . Hypertension  Father   . Diabetes Maternal Grandmother   . Heart disease Maternal Grandmother   . Hypertension Paternal Grandfather   . Breast cancer Paternal Grandmother [phone]    Social History Social History  Substance Use Topics  . Smoking status: Never Smoker   . Smokeless tobacco: Never Used  . Alcohol Use: No    Review of Systems  Constitutional: Negative for fever. Cardiovascular: Negative for chest pain. Respiratory: Negative for shortness of breath. Gastrointestinal: Positive for pelvic pain Genitourinary: Negative for dysuria. Musculoskeletal: Negative for back pain. Skin: Negative for rash. Neurological: Negative for headaches, focal weakness or numbness.   10-point ROS otherwise negative.  ____________________________________________   PHYSICAL EXAM:  VITAL SIGNS: ED Triage Vitals  Enc Vitals Group     BP 10/17/15 0954 208/145 mmHg     Pulse Rate 10/17/15 0954 82     Resp 10/17/15 0954 22     Temp 10/17/15 0954 97.9 F (36.6 C)     Temp Source 10/17/15 0954 Oral     SpO2 10/17/15 0954 100 %     Weight 10/17/15 0950 100 lb (45.36 kg)     Height 10/17/15 0950 5\' 7"  (1.702 m)     Head Cir --      Peak Flow --      Pain Score 10/17/15 0950 10   Constitutional: Alert and oriented. Well  appearing and in no distress. Eyes: Conjunctivae are normal. PERRL. Normal extraocular movements. ENT   Head: Normocephalic and atraumatic.   Nose: No congestion/rhinnorhea.   Mouth/Throat: Mucous membranes are moist.   Neck: No stridor. Hematological/Lymphatic/Immunilogical: No cervical lymphadenopathy. Cardiovascular: Normal rate, regular rhythm.  No murmurs, rubs, or gallops. Respiratory: Normal respiratory effort without tachypnea nor retractions. Breath sounds are clear and equal bilaterally. No wheezes/rales/rhonchi. Gastrointestinal: Soft and tender to palpation in the suprapubic and bilateral adnexa. No rebound. No guarding. Genitourinary: No external lesions.  Moderate amount of vaginal discharge. Tenderness to cervical motion and tenderness and bilateral neck. Musculoskeletal: Normal range of motion in all extremities. No joint effusions.  No lower extremity tenderness nor edema. Neurologic:  Normal speech and language. No gross focal neurologic deficits are appreciated.  Skin:  Skin is warm, dry and intact. No rash noted. Psychiatric: Mood and affect are normal. Speech and behavior are normal. Patient exhibits appropriate insight and judgment.  ____________________________________________    LABS (pertinent positives/negatives)  Labs Reviewed  COMPREHENSIVE METABOLIC PANEL - Abnormal; Notable for the following:    Glucose, Bld 133 (*)    ALT 10 (*)    All other components within normal limits  URINALYSIS COMPLETEWITH MICROSCOPIC ([facility] ONLY) - Abnormal; Notable for the following:    Color, Urine YELLOW (*)    APPearance CLEAR (*)    Ketones, ur 2+ (*)    Squamous Epithelial / LPF 0-5 (*)    All other components within normal limits  WET PREP, GENITAL  CHLAMYDIA/NGC RT PCR ([facility] ONLY)  LIPASE, BLOOD  CBC  HCG, QUANTITATIVE, PREGNANCY     ____________________________________________   EKG  None  ____________________________________________    RADIOLOGY  Ultrasound pending ____________________________________________   PROCEDURES  Procedure(s) performed: None  Critical Care performed: No  ____________________________________________   INITIAL IMPRESSION / ASSESSMENT AND PLAN / ED COURSE  Pertinent labs & imaging results that were available during my care of the patient were reviewed by me and considered in my medical decision making (see chart for details).  Patient presented to the emergency department today because of concerns for pelvic pain. Blood work and urine without overly concerning findings. On pelvic exam she did have tenderness to cervical motion and adnexal tenderness. This point I do have  some concern for PID however will proceed with an ultrasound.  ____________________________________________   FINAL CLINICAL IMPRESSION(S) / ED DIAGNOSES  Final diagnoses:  Pelvic pain in female     Phineas Semen, MD 10/17/15 [phone]

## 2015-10-23 ENCOUNTER — Ambulatory Visit: Payer: BC Managed Care – PPO | Admitting: Cardiovascular Disease

## 2015-12-14 HISTORY — PX: PELVIC LAPAROSCOPY: SHX162

## 2016-03-12 ENCOUNTER — Telehealth: Payer: Self-pay | Admitting: *Deleted

## 2016-03-12 ENCOUNTER — Encounter: Payer: Self-pay | Admitting: Gynecology

## 2016-03-12 ENCOUNTER — Ambulatory Visit (INDEPENDENT_AMBULATORY_CARE_PROVIDER_SITE_OTHER): Payer: BC Managed Care – PPO | Admitting: Gynecology

## 2016-03-12 VITALS — BP 120/82 | Ht 67.0 in | Wt 106.0 lb

## 2016-03-12 DIAGNOSIS — Z01419 Encounter for gynecological examination (general) (routine) without abnormal findings: Secondary | ICD-10-CM

## 2016-03-12 LAB — COMPREHENSIVE METABOLIC PANEL
ALT: 5 U/L — ABNORMAL LOW (ref 6–29)
AST: 10 U/L (ref 10–30)
Albumin: 4.3 g/dL (ref 3.6–5.1)
Alkaline Phosphatase: 39 U/L (ref 33–115)
BUN: 10 mg/dL (ref 7–25)
CHLORIDE: 104 mmol/L (ref 98–110)
CO2: 25 mmol/L (ref 20–31)
Calcium: 8.6 mg/dL (ref 8.6–10.2)
Creat: 0.56 mg/dL (ref 0.50–1.10)
GLUCOSE: 83 mg/dL (ref 65–99)
POTASSIUM: 3.5 mmol/L (ref 3.5–5.3)
Sodium: 140 mmol/L (ref 135–146)
Total Bilirubin: 0.6 mg/dL (ref 0.2–1.2)
Total Protein: 6.9 g/dL (ref 6.1–8.1)

## 2016-03-12 LAB — CBC WITH DIFFERENTIAL/PLATELET
BASOS ABS: 0 10*3/uL (ref 0.0–0.1)
Basophils Relative: 0 % (ref 0–1)
EOS ABS: 0.2 10*3/uL (ref 0.0–0.7)
EOS PCT: 3 % (ref 0–5)
HCT: 39.5 % (ref 36.0–46.0)
Hemoglobin: 13.2 g/dL (ref 12.0–15.0)
LYMPHS ABS: 1.9 10*3/uL (ref 0.7–4.0)
Lymphocytes Relative: 28 % (ref 12–46)
MCH: 30.6 pg (ref 26.0–34.0)
MCHC: 33.4 g/dL (ref 30.0–36.0)
MCV: 91.6 fL (ref 78.0–100.0)
MONOS PCT: 8 % (ref 3–12)
MPV: 8.8 fL (ref 8.6–12.4)
Monocytes Absolute: 0.6 10*3/uL (ref 0.1–1.0)
Neutro Abs: 4.2 10*3/uL (ref 1.7–7.7)
Neutrophils Relative %: 61 % (ref 43–77)
PLATELETS: 332 10*3/uL (ref 150–400)
RBC: 4.31 MIL/uL (ref 3.87–5.11)
RDW: 12.8 % (ref 11.5–15.5)
WBC: 6.9 10*3/uL (ref 4.0–10.5)

## 2016-03-12 LAB — LIPID PANEL
CHOL/HDL RATIO: 3.4 ratio (ref <=5.0)
Cholesterol: 131 mg/dL (ref 125–200)
HDL: 39 mg/dL — ABNORMAL LOW (ref >=46)
LDL CALC: 84 mg/dL (ref <130)
Triglycerides: 41 mg/dL (ref <150)
VLDL: 8 mg/dL (ref <30)

## 2016-03-12 NOTE — Patient Instructions (Signed)
You should hear from Dr. Lyndal RainbowYalcinkaya's office over the next several weeks. If you do not call my office.

## 2016-03-12 NOTE — Telephone Encounter (Signed)
-----   Message from Dara Lordsimothy P Fontaine, MD sent at 03/12/2016  3:47 PM EDT ----- Can you call Dr. Lyndal RainbowYalcinkaya's office in reference to this patient. She apparently has seen them in the [place] office and [place] in [place]. She was told that their office was going to call her to arrange an appointment but they never did. She is currently on medications they prescribed.

## 2016-03-12 NOTE — Telephone Encounter (Signed)
Nurse called back and said she tried to contact pt but her voicemail was full, she will try again.

## 2016-03-12 NOTE — Progress Notes (Signed)
    Geanie Berlinshley J Westrich 1986-07-26 [id number]        30 y.o.  G0P0  for annual exam.  Several issues noted below.  Past medical history,surgical history, problem list, medications, allergies, family history and social history were all reviewed and documented as reviewed in the EPIC chart.  ROS:  Performed with pertinent positives and negatives included in the history, assessment and plan.   Additional significant findings :  none   Exam: Delena ServeKim Alexis assistant Filed Vitals:   03/12/16 1525  BP: 120/82  Height: 5\' 7"  (1.702 m)  Weight: 106 lb (48.081 kg)   General appearance:  Normal affect, orientation and appearance. Skin: Grossly normal HEENT: Without gross lesions.  No cervical or supraclavicular adenopathy. Thyroid normal.  Lungs:  Clear without wheezing, rales or rhonchi Cardiac: RR, without RMG Abdominal:  Soft, nontender, without masses, guarding, rebound, organomegaly or hernia Breasts:  Examined lying and sitting without masses, retractions, discharge or axillary adenopathy. Pelvic:  Ext/BUS/vagina normal  Cervix normal  Uterus axial, normal size, shape and contour, midline and mobile nontender   Adnexa without masses or tenderness    Anus and perineum normal   Rectovaginal normal sphincter tone without palpated masses or tenderness.    Assessment/Plan:  30 y.o. G0P0 female for annual exam without menses.   1. Infertility. Patient actively being seen and treated through Dr. Lyndal RainbowYalcinkaya's group. Currently on Femara and Aygestin. Had HSG, stimulated cycles with IUI without success. Follow up ultrasounds per her history suggested bilateral endometriomas. She did have ultrasound last year which suggested bilateral endometriomas.  She is following up with them with what sounds to be planning for surgery and she'll continue follow up with them. 2. Pap smear 02/2015 negative. No Pap smear done today. No history of significant abnormal Pap smears previously. 3. Breast health. SBE  monthly reviewed. Plan mammography closer to 41. 4. Health maintenance. Baseline CBC comprehensive metabolic panel lipid profile urinalysis ordered. Follow up for ongoing fertility care with Dr. April MansonYalcinkaya otherwise follow up here in one year.   Dara LordsFONTAINE,Whitley Strycharz P MD, 3:44 PM 03/12/2016

## 2016-03-12 NOTE — Telephone Encounter (Signed)
I called Dr.Yalcinkaya office and was told pt was last seen in Dec. 2016, front desk transferred me to the nurse and I had to leave a message for her to call me and the patient to figure the below out.

## 2016-03-13 LAB — URINALYSIS W MICROSCOPIC + REFLEX CULTURE
BILIRUBIN URINE: NEGATIVE
Bacteria, UA: NONE SEEN [HPF]
CRYSTALS: NONE SEEN [HPF]
Casts: NONE SEEN [LPF]
Glucose, UA: NEGATIVE
Hgb urine dipstick: NEGATIVE
Ketones, ur: NEGATIVE
Leukocytes, UA: NEGATIVE
NITRITE: NEGATIVE
SPECIFIC GRAVITY, URINE: 1.019 (ref 1.001–1.035)
Squamous Epithelial / LPF: NONE SEEN [HPF] (ref <=5)
WBC UA: NONE SEEN WBC/HPF (ref <=5)
Yeast: NONE SEEN [HPF]
pH: 8.5 — ABNORMAL HIGH (ref 5.0–8.0)

## 2016-03-15 LAB — URINE CULTURE: Colony Count: 60000

## 2016-04-20 IMAGING — US US TRANSVAGINAL NON-OB
1 series · 13 of 25 positions shown · non-contrast
Comparison: Pelvic ultrasound 08/27/2015, 05/26/2015 and 03/26/2015 from
[HOSPITAL] Gynecology.

CLINICAL DATA: 29-year-old presenting with acute onset of
generalized pelvic pain which began earlier today. LMP 09/23/2015.

EXAM:
TRANSABDOMINAL AND TRANSVAGINAL ULTRASOUND OF PELVIS
DOPPLER ULTRASOUND OF OVARIES
TECHNIQUE: Both transabdominal and transvaginal ultrasound examinations of the
pelvis were performed. Transabdominal technique was performed for
global imaging of the pelvis including uterus, ovaries, adnexal
regions, and pelvic cul-de-sac.
It was necessary to proceed with endovaginal exam following the
transabdominal exam to visualize the ovaries due to incomplete
bladder distension. Color and duplex Doppler ultrasound was utilized
to evaluate blood flow to the ovaries.

[Series 1: us transvaginal non-ob · 0.18mm/px · 13 of 94 slices shown]
[im 1/94]
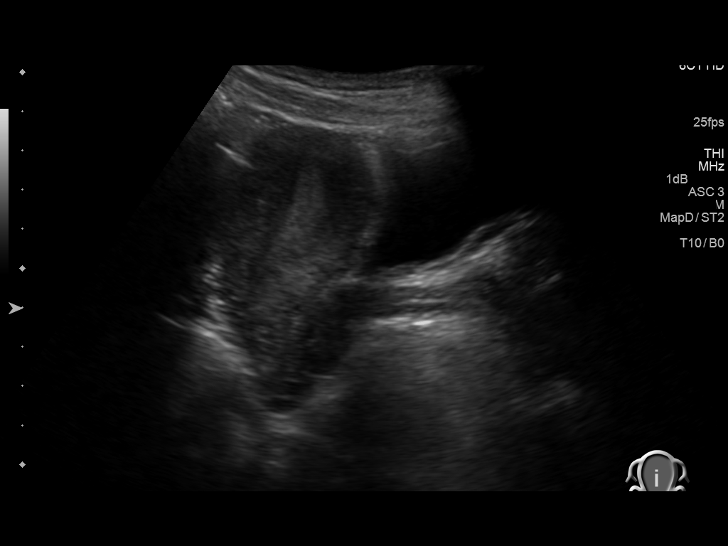
[im 8/94]
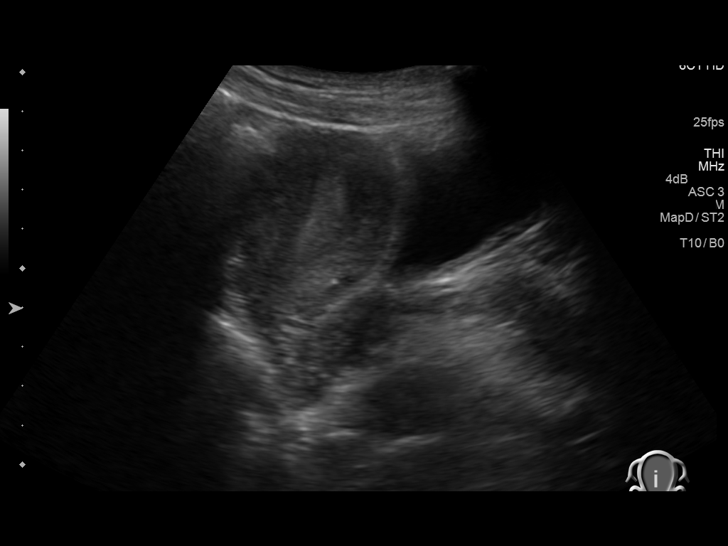
[im 16/94]
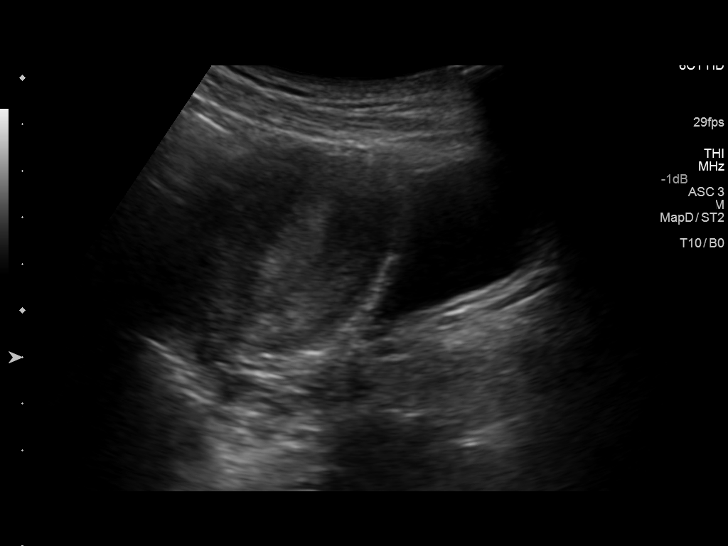
[im 24/94]
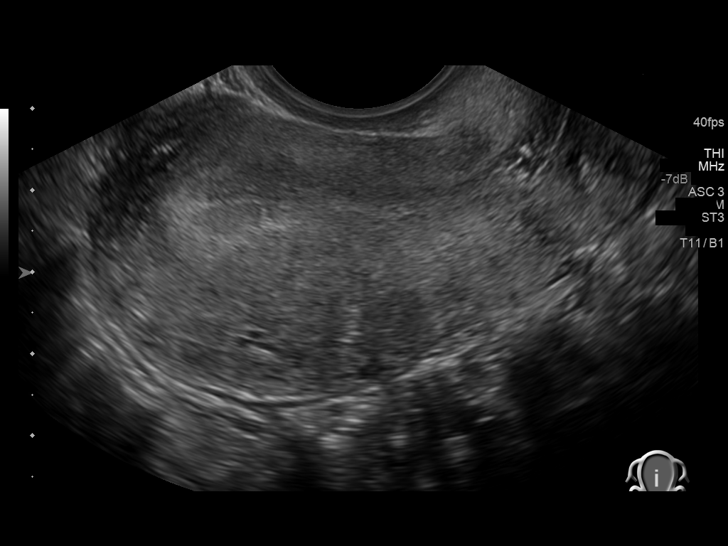
[im 32/94]
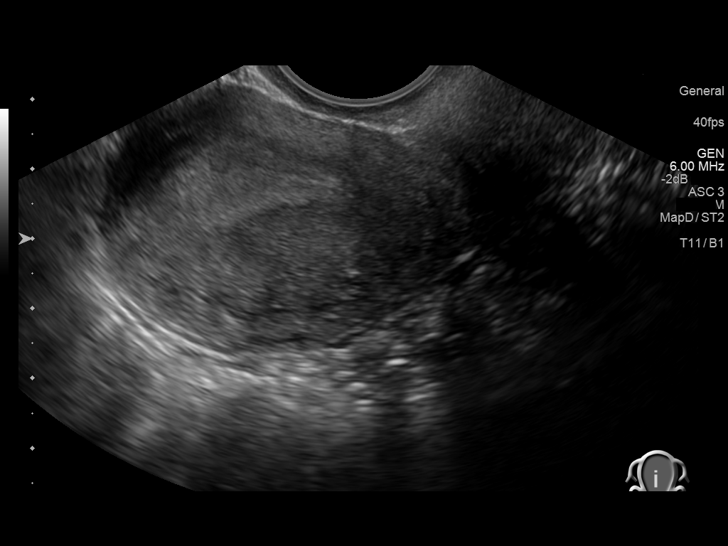
[im 39/94]
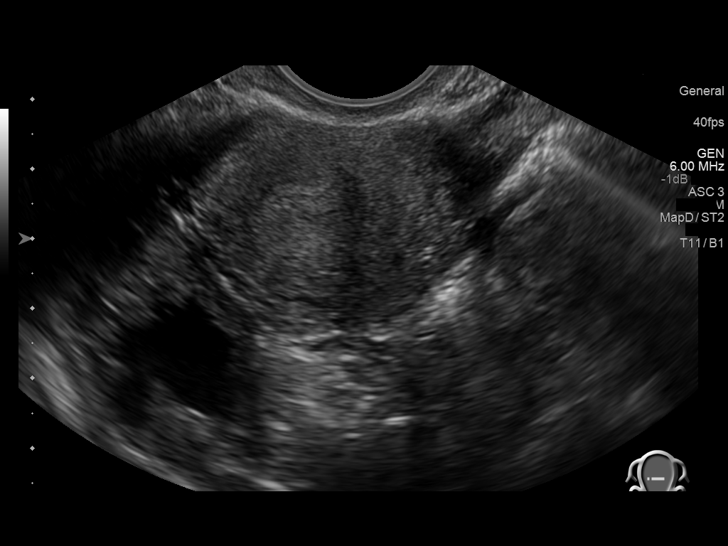
[im 47/94]
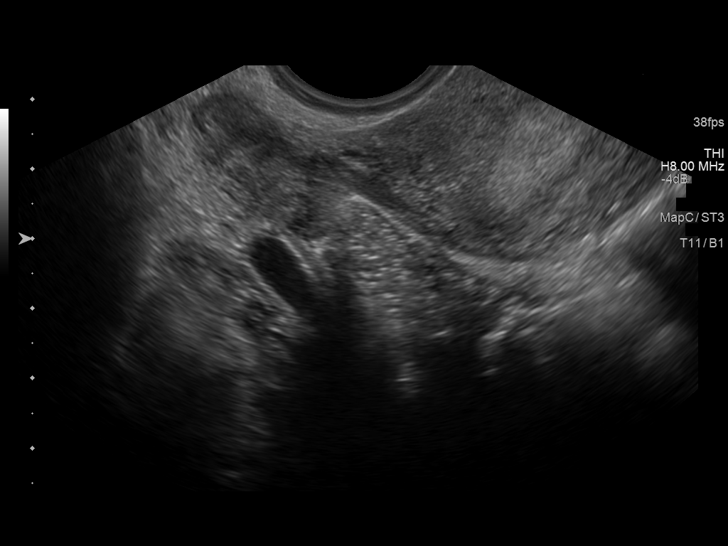
[im 55/94]
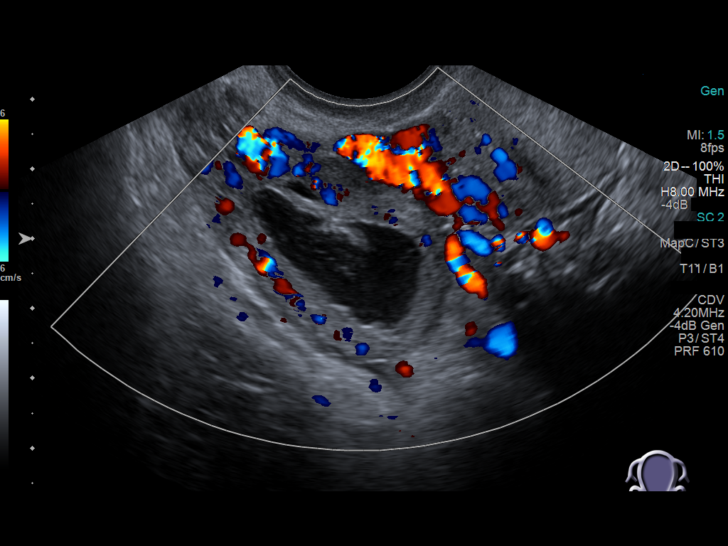
[im 63/94]
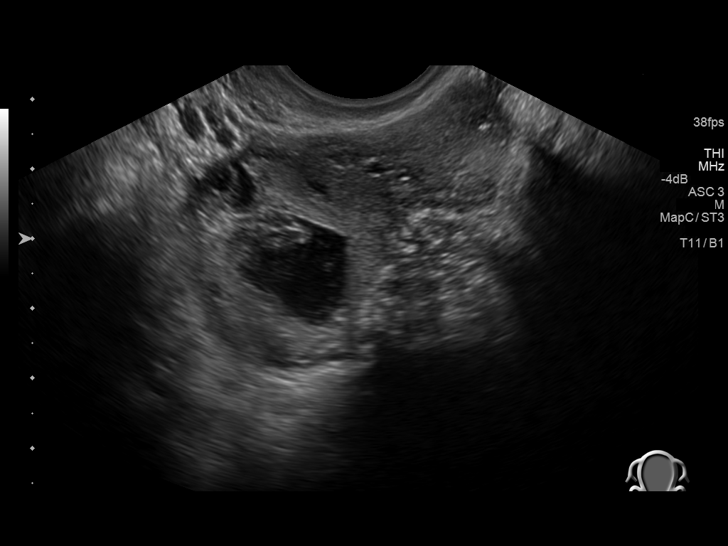
[im 70/94]
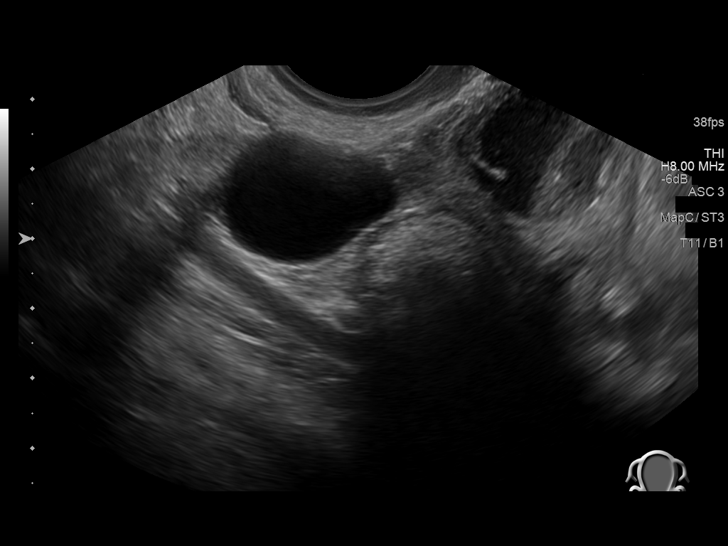
[im 78/94]
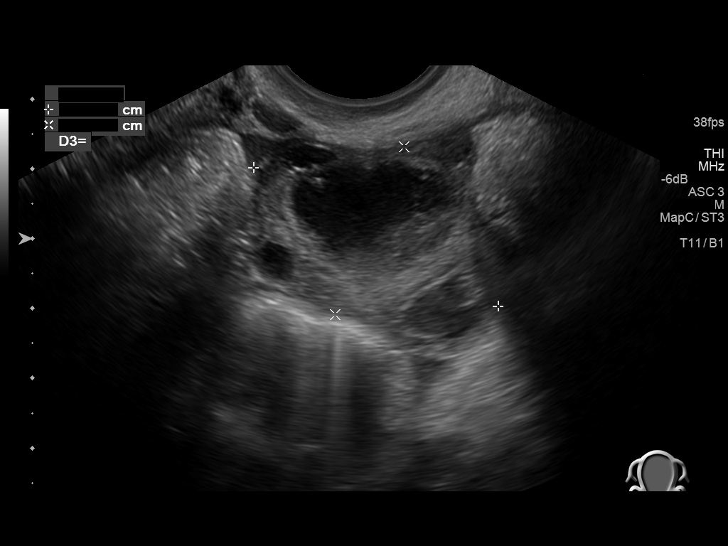
[im 86/94]
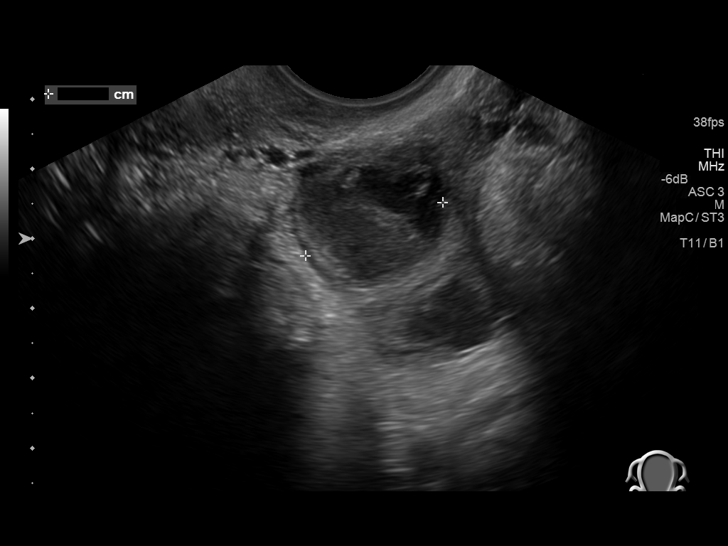
[im 94/94]
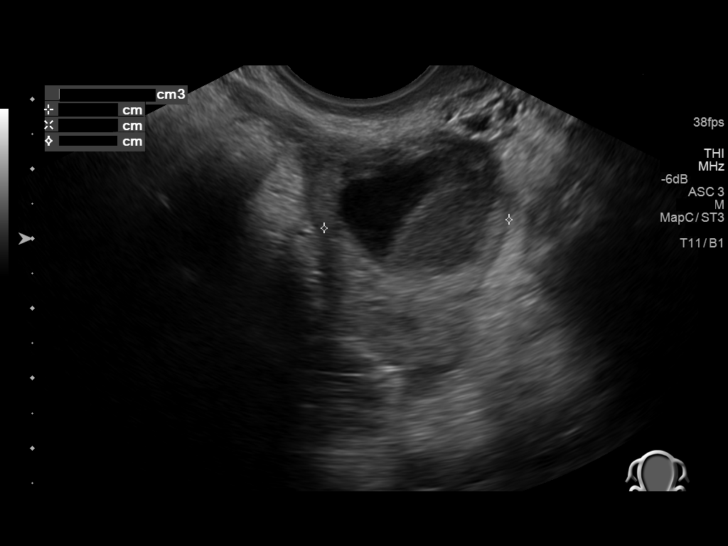

[13 of 25 positions shown; findings below may reference images not displayed]

FINDINGS: Uterus

Measurements: Approximately 7.5 x 3.5 x 4.8 cm. Homogeneous
echotexture without focal fibroid or other myometrial abnormality.
Normal-appearing uterine cervix.

Endometrium

Thickness: Approximately 10 mm. Normal appearance without evidence
of endometrial fluid or mass.

Right ovary

Measurements: Approximately 4.8 x 2.4 x 2.7 cm. Collapsing cyst in
the right ovary with internal echoes, significantly decreased in
size since the 08/27/2015 office ultrasound, current measurements
approximating 2.7 x 1.3 x 1.6 cm (previously 4.0 x 4.4 x 2.9 cm).
Normal color Doppler flow within the ovary.

Left ovary

Measurements: Approximately 4.0 x 2.6 x 2.7 cm. Complex, likely
hemorrhagic cyst with layering blood products or debris in the left
ovary measuring approximately 2.1 x 2.1 x 1.9 cm (previously 2.5 x
1.6 x 2.5 cm). Left paraovarian cyst measuring approximately 2.8 x
2.0 x 2.5 cm (previously 2.7 x 1.9 x 2.2 cm). Normal color Doppler
flow within the ovary.

Pulsed Doppler evaluation of both ovaries demonstrates normal
low-resistance arterial and venous waveforms.

Other findings

No free fluid.
IMPRESSION: 1. Collapsing complex cyst in the right ovary, decreased in size
since a pelvic ultrasound performed in the office at [HOSPITAL]
Gynecology on 08/27/2015. Measurements provided above.
2. Slight decrease in size of a likely hemorrhagic cyst in the left
ovary since that prior ultrasound. Measurements provided above.
3. Stable left paraovarian cyst.
4. No evidence of ovarian torsion.
5. No free pelvic fluid to suggest recent cyst rupture.

## 2016-11-08 ENCOUNTER — Ambulatory Visit (INDEPENDENT_AMBULATORY_CARE_PROVIDER_SITE_OTHER): Payer: BC Managed Care – PPO | Admitting: Gynecology

## 2016-11-08 ENCOUNTER — Encounter: Payer: Self-pay | Admitting: Gynecology

## 2016-11-08 VITALS — BP 110/66

## 2016-11-08 DIAGNOSIS — N912 Amenorrhea, unspecified: Secondary | ICD-10-CM | POA: Diagnosis not present

## 2016-11-08 DIAGNOSIS — R300 Dysuria: Secondary | ICD-10-CM | POA: Diagnosis not present

## 2016-11-08 DIAGNOSIS — N760 Acute vaginitis: Secondary | ICD-10-CM | POA: Diagnosis not present

## 2016-11-08 LAB — URINALYSIS W MICROSCOPIC + REFLEX CULTURE
BILIRUBIN URINE: NEGATIVE
Casts: NONE SEEN [LPF]
Crystals: NONE SEEN [HPF]
GLUCOSE, UA: NEGATIVE
KETONES UR: NEGATIVE
NITRITE: NEGATIVE
PH: 7 (ref 5.0–8.0)
Protein, ur: NEGATIVE
SPECIFIC GRAVITY, URINE: 1.02 (ref 1.001–1.035)

## 2016-11-08 LAB — WET PREP FOR TRICH, YEAST, CLUE
CLUE CELLS WET PREP: NONE SEEN
TRICH WET PREP: NONE SEEN

## 2016-11-08 LAB — PREGNANCY, URINE: PREG TEST UR: POSITIVE — AB

## 2016-11-08 MED ORDER — NITROFURANTOIN MONOHYD MACRO 100 MG PO CAPS: 100.0000 mg | ORAL_CAPSULE | Freq: Two times a day (BID) | ORAL | 0 refills | Status: DC

## 2016-11-08 NOTE — Patient Instructions (Signed)
Take the antibiotic twice daily for 5 days.   Use the over-the-counter yeast medication with clotrimazole.  Schedule a new OB appointment with one of the obstetricians in town. start on a prenatal vitamin  daily.

## 2016-11-08 NOTE — Progress Notes (Signed)
    Geanie Berlinshley J Manfredi June 09, 1986 [id number]        30 y.o.  G0P0 with several days of worsening dysuria and suprapubic pain. No fever, chills, nausea coming vomiting, frequency, urgency. Also notes worsening vaginal discharge with itching of the last several days. Several days late for her menses.  Past medical history,surgical history, problem list, medications, allergies, family history and social history were all reviewed and documented in the EPIC chart.  Directed ROS with pertinent positives and negatives documented in the history of present illness/assessment and plan.  Exam: Kennon PortelaKim Gardner assistant Vitals:   11/08/16 1120  BP: 110/66   General appearance:  Normal Spine straight without CVA tenderness Abdomen soft nontender without masses guarding rebound Pelvic external BUS vagina with thick white discharge. Cervix normal. Uterus grossly normal size midline mobile nontender. Adnexa without masses or tenderness.  Assessment/Plan:  30 y.o. G0P0 with history and exam as above. Urinalysis consistent with UTI. Treat with Macrobid 100 mg twice a day 5 days. Wet prep and exam consistent with yeast vaginitis. Use OTC Chlortrimazole. Start on prenatal vitamins daily.  Schedule an appointment with an obstetrical group for ongoing obstetrical care.    Dara LordsFONTAINE,Jalonda Antigua P MD, 12:09 PM 11/08/2016

## 2016-11-10 LAB — URINE CULTURE

## 2016-12-13 NOTE — L&D Delivery Note (Signed)
Delivery Note At 1:55 PM a viable female was delivered via Vaginal, Vacuum (Extractor) (Presentation: LOA ;  ).  APGAR: 6, 8; weight  pending.   Placenta status: Pathology, .  Cord: WNL with the following complications: Thick meconsium, nrFHT Cord pH: sent    Patient presented in labor. Progressed to c/c/+2. Pushed x 2 hours but then temperature started to become elevated up to 100.0 F. Fetal tachycardia noted to the 180s. Maternal pushing effort decreased at this time and vertex arrested at +3 station. Thus, decision made to proceed with VAVD for nrFHT in the setting of maternal exhaustion and arrest of descent. Discussed to fetus risks with patient and partner including bruising, hematoma, and ICH. Verbally consented and agreed. Bladder was emptied. EFW 6#10. Anesthesia found to be adequate. Position confirmed to be LOA. Pelvis felt to be adequate. Mediolateral episiotomy performed. Vacuum placed at flexion point. Vertex easily delivered over four contractions.    Anesthesia:  CLE Episiotomy: Right Mediolateral (second degree tear) Lacerations: None Suture Repair: 2.0 3.0 vicryl Est. Blood Loss (mL):    Mom to postpartum.  Baby to Couplet care / Skin to Skin.  Ranae Pilalise Jennifer Leger 07/16/2017, 2:31 PM

## 2016-12-20 LAB — OB RESULTS CONSOLE ABO/RH: RH Type: POSITIVE

## 2016-12-20 LAB — OB RESULTS CONSOLE GC/CHLAMYDIA
Chlamydia: NEGATIVE
GC PROBE AMP, GENITAL: NEGATIVE

## 2016-12-20 LAB — OB RESULTS CONSOLE RUBELLA ANTIBODY, IGM: RUBELLA: IMMUNE

## 2016-12-20 LAB — OB RESULTS CONSOLE RPR: RPR: NONREACTIVE

## 2016-12-20 LAB — OB RESULTS CONSOLE HEPATITIS B SURFACE ANTIGEN: HEP B S AG: NEGATIVE

## 2016-12-20 LAB — OB RESULTS CONSOLE HIV ANTIBODY (ROUTINE TESTING): HIV: NONREACTIVE

## 2017-03-14 ENCOUNTER — Encounter: Payer: BC Managed Care – PPO | Admitting: Gynecology

## 2017-05-18 ENCOUNTER — Encounter (HOSPITAL_COMMUNITY): Payer: Self-pay

## 2017-05-18 ENCOUNTER — Inpatient Hospital Stay (HOSPITAL_COMMUNITY)
Admission: AD | Admit: 2017-05-18 | Discharge: 2017-05-18 | Disposition: A | Discharge status: Home / Self Care | Payer: BC Managed Care – PPO | Source: Ambulatory Visit | Attending: Obstetrics and Gynecology | Admitting: Obstetrics and Gynecology

## 2017-05-18 DIAGNOSIS — O212 Late vomiting of pregnancy: Secondary | ICD-10-CM | POA: Insufficient documentation

## 2017-05-18 DIAGNOSIS — Z91040 Latex allergy status: Secondary | ICD-10-CM | POA: Insufficient documentation

## 2017-05-18 DIAGNOSIS — Z8249 Family history of ischemic heart disease and other diseases of the circulatory system: Secondary | ICD-10-CM | POA: Insufficient documentation

## 2017-05-18 DIAGNOSIS — Z3A31 31 weeks gestation of pregnancy: Secondary | ICD-10-CM | POA: Diagnosis not present

## 2017-05-18 DIAGNOSIS — Z888 Allergy status to other drugs, medicaments and biological substances status: Secondary | ICD-10-CM | POA: Diagnosis not present

## 2017-05-18 DIAGNOSIS — Z881 Allergy status to other antibiotic agents status: Secondary | ICD-10-CM | POA: Diagnosis not present

## 2017-05-18 DIAGNOSIS — O219 Vomiting of pregnancy, unspecified: Secondary | ICD-10-CM

## 2017-05-18 DIAGNOSIS — R1110 Vomiting, unspecified: Secondary | ICD-10-CM | POA: Diagnosis present

## 2017-05-18 LAB — URINALYSIS, ROUTINE W REFLEX MICROSCOPIC
BILIRUBIN URINE: NEGATIVE
Glucose, UA: 50 mg/dL — AB
Hgb urine dipstick: NEGATIVE
KETONES UR: 80 mg/dL — AB
LEUKOCYTES UA: NEGATIVE
Nitrite: NEGATIVE
PH: 5 (ref 5.0–8.0)
Protein, ur: 100 mg/dL — AB
Specific Gravity, Urine: 1.02 (ref 1.005–1.030)

## 2017-05-18 MED ORDER — ONDANSETRON HCL 4 MG/2ML IJ SOLN
4.0000 mg | Freq: Once | INTRAMUSCULAR | Status: AC
  Administered 2017-05-18: 4 mg via INTRAVENOUS
  Filled 2017-05-18: qty 2

## 2017-05-18 MED ORDER — ONDANSETRON HCL 4 MG PO TABS: 4.0000 mg | ORAL_TABLET | Freq: Three times a day (TID) | ORAL | 1 refills | Status: DC | PRN

## 2017-05-18 MED ORDER — DEXTROSE 5 % IN LACTATED RINGERS IV BOLUS
1000.0000 mL | Freq: Once | INTRAVENOUS | Status: AC
  Administered 2017-05-18: 1000 mL via INTRAVENOUS

## 2017-05-18 NOTE — Discharge Instructions (Signed)
Nausea, Adult Feeling sick to your stomach (nausea) means that your stomach is upset or you feel like you have to throw up (vomit). Feeling sick to your stomach is usually not serious, but it may be an early sign of a more serious medical problem. As you feel sicker to your stomach, it can lead to throwing up (vomiting). If you throw up, or if you are not able to drink enough fluids, there is a risk of dehydration. Dehydration can make you feel tired and thirsty, have a dry mouth, and pee (urinate) less often. Older adults and people who have other diseases or a weak defense (immune) system have a higher risk of dehydration. The main goal of treating this condition is to:  Limit how often you feel sick to your stomach.  Prevent throwing up and dehydration.  Follow these instructions at home: Follow instructions from your doctor about how to care for yourself at home. Eating and drinking Follow these recommendations as told by your doctor:  Take an oral rehydration solution (ORS). This is a drink that is sold at pharmacies and stores.  Drink clear fluids in small amounts as you are able, such as: ? Water. ? Ice chips. ? Fruit juice that has water added (diluted fruit juice). ? Low-calorie sports drinks.  Eat bland, easy to digest foods in small amounts as you are able, such as: ? Bananas. ? Applesauce. ? Rice. ? Lean meats. ? Toast. ? Crackers.  Avoid drinking fluids that contain a lot of sugar or caffeine.  Avoid alcohol.  Avoid spicy or fatty foods.  General instructions  Drink enough fluid to keep your pee (urine) clear or pale yellow.  Wash your hands often. If you cannot use soap and water, use hand sanitizer.  Make sure that all people in your household wash their hands well and often.  Rest at home while you get better.  Take over-the-counter and prescription medicines only as told by your doctor.  Breathe slowly and deeply when you feel sick to your  stomach.  Watch your condition for any changes.  Keep all follow-up visits as told by your doctor. This is important. Contact a doctor if:  You have a headache.  You have new symptoms.  You feel sicker to your stomach.  You have a fever.  You feel light-headed or dizzy.  You throw up.  You are not able to keep fluids down. Get help right away if:  You have pain in your chest, neck, arm, or jaw.  You feel very weak or you pass out (faint).  You have throw up that is bright red or looks like coffee grounds.  You have bloody or black poop (stools), or poop that looks like tar.  You have a very bad headache, a stiff neck, or both.  You have very bad pain, cramping, or bloating in your belly.  You have a rash.  You have trouble breathing or you are breathing very quickly.  Your heart is beating very quickly.  Your skin feels cold and clammy.  You feel confused.  You have pain while peeing.  You have signs of dehydration, such as: ? Dark pee, or very little or no pee. ? Cracked lips. ? Dry mouth. ? Sunken eyes. ? Sleepiness. ? Weakness. These symptoms may be an emergency. Do not wait to see if the symptoms will go away. Get medical help right away. Call your local emergency services (911 in the [place] Do not drive yourself to   the hospital. This information is not intended to replace advice given to you by your health care provider. Make sure you discuss any questions you have with your health care provider. Document Released: 11/18/2011 Document Revised: 05/06/2016 Document Reviewed: 08/05/2015 Elsevier Interactive Patient Education  2018 [employer]  

## 2017-05-18 NOTE — MAU Note (Signed)
Pt states she woke up at 0430am today and started vomiting. Pt states she has been vomiting about every 30 mins since that time. Pt denies loose stool. Pt states baby is moving normally. Pt denies bleeding, leaking, and contractions.

## 2017-05-18 NOTE — MAU Provider Note (Signed)
History     CSN: [id number]  Arrival date & time 05/18/17  1204   First Provider Initiated Contact with Patient 05/18/17 1301      Chief Complaint  Patient presents with  . Emesis    HPI  [age] yo MW G1 at 6531 4/[redacted] weeks EGA is here for new onset nausea and vomitting. No one else in the family is sick. She denies diarrhea, fever.  Past Medical History:  Diagnosis Date  . Dizziness   . Heart palpitations     Past Surgical History:  Procedure Laterality Date  . APPENDECTOMY     dr Lovell Sheehanjenkins 1999  . CHOLECYSTECTOMY  06/05/2012   Procedure: LAPAROSCOPIC CHOLECYSTECTOMY;  Surgeon: Marlane HatcherWilliam S Bradford, MD;  Location: [facility];  Service: General;  Laterality: N/A;  . PELVIC LAPAROSCOPY  2017   Endometriosis    Family History  Problem Relation Age of Onset  . Hypertension Father   . Diabetes Maternal Grandmother   . Heart disease Maternal Grandmother   . Hypertension Paternal Grandfather   . Breast cancer Paternal Grandmother [phone]    Social History  Substance Use Topics  . Smoking status: Never Smoker  . Smokeless tobacco: Never Used  . Alcohol use No    OB History    Gravida Para Term Preterm AB Living   1 0           SAB TAB Ectopic Multiple Live Births                  Review of Systems  Allergies  Ceclor [cefaclor]; Promethazine hcl; and Latex  Home Medications    BP 109/66 (BP Location: Right Arm)   Pulse 88   Temp 99.5 F (37.5 C) (Oral)   Resp 18   Ht 5\' 7"  (1.702 m)   Wt 56.2 kg (124 lb)   LMP 10/09/2016   SpO2 98%   BMI 19.42 kg/m   Physical Exam  WNWHWFNAD Heart-rrr Lungs- CTAB Abd- gravid, benign Cervix- closed/50/-2/very posterior  MAU Course  Procedures (including critical care time)  Labs Reviewed  URINALYSIS, ROUTINE W REFLEX MICROSCOPIC - Abnormal; Notable for the following:       Result Value   Glucose, UA 50 (*)    Ketones, ur 80 (*)    Protein, ur 100 (*)    Bacteria, UA RARE (*)    Squamous Epithelial / LPF 0-5 (*)    All  other components within normal limits   No results found.   No diagnosis found.    MDM  She was given D5LR (2 liters) along with 4 mg of IV zofran. She was able to tolerate ginger ale and ice chips.  She had no more vomitting in the MAU. She voiced her readiness to go home.

## 2017-05-20 ENCOUNTER — Encounter (HOSPITAL_COMMUNITY): Payer: Self-pay | Admitting: *Deleted

## 2017-05-20 ENCOUNTER — Inpatient Hospital Stay (HOSPITAL_COMMUNITY)
Admission: AD | Admit: 2017-05-20 | Discharge: 2017-05-21 | Disposition: A | Discharge status: Home / Self Care | Payer: BC Managed Care – PPO | Source: Ambulatory Visit | Attending: Obstetrics and Gynecology | Admitting: Obstetrics and Gynecology

## 2017-05-20 DIAGNOSIS — Z9049 Acquired absence of other specified parts of digestive tract: Secondary | ICD-10-CM | POA: Diagnosis not present

## 2017-05-20 DIAGNOSIS — O4703 False labor before 37 completed weeks of gestation, third trimester: Secondary | ICD-10-CM | POA: Diagnosis not present

## 2017-05-20 DIAGNOSIS — Z3A31 31 weeks gestation of pregnancy: Secondary | ICD-10-CM | POA: Insufficient documentation

## 2017-05-20 LAB — URINALYSIS, ROUTINE W REFLEX MICROSCOPIC
Bilirubin Urine: NEGATIVE
GLUCOSE, UA: NEGATIVE mg/dL
Hgb urine dipstick: NEGATIVE
Ketones, ur: NEGATIVE mg/dL
LEUKOCYTES UA: NEGATIVE
Nitrite: NEGATIVE
PH: 7 (ref 5.0–8.0)
Protein, ur: NEGATIVE mg/dL
Specific Gravity, Urine: 1.005 (ref 1.005–1.030)

## 2017-05-20 NOTE — MAU Provider Note (Signed)
History     CSN: [id number]  Arrival date and time: 05/20/17 2255   First Provider Initiated Contact with Patient 05/20/17 2334      Chief Complaint  Patient presents with  . Contractions   HPI Ms. Maria Walton is a 31 y.o. G1P0 at 103w6d who presents to MAU today with complaint of contractions q 20 minutes since 1630 today. The patient states that she has had few intermittent contractions over the last few days, but this evening they were more consistent and painful than previously. She tried rest and PO fluids without any change. She denies vaginal bleeding or LOF or complications with the pregnancy. She reports normal fetal movement. She states the last painful contraction she had was upon arrival in MAU and she has only noted very mild tightening for the last 45 minutes.   OB History    Gravida Para Term Preterm AB Living   1 0           SAB TAB Ectopic Multiple Live Births                  Past Medical History:  Diagnosis Date  . Dizziness   . Heart palpitations     Past Surgical History:  Procedure Laterality Date  . APPENDECTOMY     dr Lovell Sheehan 1999  . CHOLECYSTECTOMY  06/05/2012   Procedure: LAPAROSCOPIC CHOLECYSTECTOMY;  Surgeon: Marlane Hatcher, MD;  Location: [facility];  Service: General;  Laterality: N/A;  . PELVIC LAPAROSCOPY  2017   Endometriosis    Family History  Problem Relation Age of Onset  . Hypertension Father   . Diabetes Maternal Grandmother   . Heart disease Maternal Grandmother   . Hypertension Paternal Grandfather   . Breast cancer Paternal Grandmother 24    Social History  Substance Use Topics  . Smoking status: Never Smoker  . Smokeless tobacco: Never Used  . Alcohol use No    Allergies:  Allergies  Allergen Reactions  . Ceclor [Cefaclor] Hives    Childhood allergy  . Promethazine Hcl Other (See Comments)    hyperactivity  . Latex Rash    Rash when using latex gloves    No prescriptions prior to admission.    Review of  Systems  Constitutional: Negative for fever.  Gastrointestinal: Positive for abdominal pain. Negative for constipation, diarrhea, nausea and vomiting.  Genitourinary: Negative for dysuria, frequency, urgency, vaginal bleeding and vaginal discharge.   Physical Exam   Blood pressure (!) 108/51, pulse 73, temperature 99 F (37.2 C), resp. rate 18, height 5\' 7"  (1.702 m), weight 126 lb (57.2 kg), last menstrual period 10/09/2016.  Physical Exam  Nursing note and vitals reviewed. Constitutional: She is oriented to person, place, and time. She appears well-developed and well-nourished. No distress.  HENT:  Head: Normocephalic and atraumatic.  Cardiovascular: Normal rate.   Respiratory: Effort normal.  GI: Soft. She exhibits no distension and no mass. There is no tenderness. There is no rebound and no guarding.  Neurological: She is alert and oriented to person, place, and time.  Skin: Skin is warm and dry. No erythema.  Psychiatric: She has a normal mood and affect.  Dilation: Closed Exam by:: Vonzella Nipple PA  Results for orders placed or performed during the hospital encounter of 05/20/17 (from the past 24 hour(s))  Urinalysis, Routine w reflex microscopic     Status: Abnormal   Collection Time: 05/20/17 11:14 PM  Result Value Ref Range   Color, Urine STRAW (  A) YELLOW   APPearance CLEAR CLEAR   Specific Gravity, Urine 1.005 1.005 - 1.030   pH 7.0 5.0 - 8.0   Glucose, UA NEGATIVE NEGATIVE mg/dL   Hgb urine dipstick NEGATIVE NEGATIVE   Bilirubin Urine NEGATIVE NEGATIVE   Ketones, ur NEGATIVE NEGATIVE mg/dL   Protein, ur NEGATIVE NEGATIVE mg/dL   Nitrite NEGATIVE NEGATIVE   Leukocytes, UA NEGATIVE NEGATIVE     MAU Course  Procedures None  MDM UA today without evidence of infection or dehydration FFN collected prior to SVE, but discarded since cervix is closed Discussed patient with Dr. Corinna LinesGrewal. Ok for discharge at this time with preterm labor precautions.   Assessment and  Plan  A: SIUP at 2915w6d Preterm contractions   P: Discharge home Preterm labor precautions discussed Patient advised to follow-up with [facility] as scheduled for routine prenatal care or sooner PRN Patient may return to MAU as needed or if her condition were to change or worsen  Vonzella NippleJulie Wenzel, PA-C 05/21/2017, 1:15 AM

## 2017-05-20 NOTE — MAU Note (Signed)
Pt's cervix was checked by Ronnie DossJulie Wenzel,NP at 23:42. Cervix was closed but babies head was really low. Raynelle FanningJulie was calling Dr.Grewal to see what Dr.Grewal wants to do at this point. FFN was collected but has not been ordered to send.

## 2017-05-20 NOTE — MAU Note (Signed)
Have had ctxs off an on for awhile. Since 1630 they have been more regular. Tried drinking flds and changing positions and nothing has helped. Called on-call person and told to come in. Ctxs not painful but just tightening. Denies vag bleeding or lOF.

## 2017-05-21 DIAGNOSIS — O4703 False labor before 37 completed weeks of gestation, third trimester: Secondary | ICD-10-CM | POA: Diagnosis not present

## 2017-05-21 NOTE — Progress Notes (Signed)
OK to d/c efm per Vonzella NippleJulie Wenzel PA.

## 2017-05-21 NOTE — Discharge Instructions (Signed)
Braxton Hicks Contractions °Contractions of the uterus can occur throughout pregnancy, but they are not always a sign that you are in labor. You may have practice contractions called Braxton Hicks contractions. These false labor contractions are sometimes confused with true labor. °What are Braxton Hicks contractions? °Braxton Hicks contractions are tightening movements that occur in the muscles of the uterus before labor. Unlike true labor contractions, these contractions do not result in opening (dilation) and thinning of the cervix. Toward the end of pregnancy (32-34 weeks), Braxton Hicks contractions can happen more often and may become stronger. These contractions are sometimes difficult to tell apart from true labor because they can be very uncomfortable. You should not feel embarrassed if you go to the hospital with false labor. °Sometimes, the only way to tell if you are in true labor is for your health care provider to look for changes in the cervix. The health care provider will do a physical exam and may monitor your contractions. If you are not in true labor, the exam should show that your cervix is not dilating and your water has not broken. °If there are no prenatal problems or other health problems associated with your pregnancy, it is completely safe for you to be sent home with false labor. You may continue to have Braxton Hicks contractions until you go into true labor. °How can I tell the difference between true labor and false labor? °· Differences °? False labor °? Contractions last 30-70 seconds.: Contractions are usually shorter and not as strong as true labor contractions. °? Contractions become very regular.: Contractions are usually irregular. °? Discomfort is usually felt in the top of the uterus, and it spreads to the lower abdomen and low back.: Contractions are often felt in the front of the lower abdomen and in the groin. °? Contractions do not go away with walking.: Contractions may  go away when you walk around or change positions while lying down. °? Contractions usually become more intense and increase in frequency.: Contractions get weaker and are shorter-lasting as time goes on. °? The cervix dilates and gets thinner.: The cervix usually does not dilate or become thin. °Follow these instructions at home: °· Take over-the-counter and prescription medicines only as told by your health care provider. °· Keep up with your usual exercises and follow other instructions from your health care provider. °· Eat and drink lightly if you think you are going into labor. °· If Braxton Hicks contractions are making you uncomfortable: °? Change your position from lying down or resting to walking, or change from walking to resting. °? Sit and rest in a tub of warm water. °? Drink enough fluid to keep your urine clear or pale yellow. Dehydration may cause these contractions. °? Do slow and deep breathing several times an hour. °· Keep all follow-up prenatal visits as told by your health care provider. This is important. °Contact a health care provider if: °· You have a fever. °· You have continuous pain in your abdomen. °Get help right away if: °· Your contractions become stronger, more regular, and closer together. °· You have fluid leaking or gushing from your vagina. °· You pass blood-tinged mucus (bloody show). °· You have bleeding from your vagina. °· You have low back pain that you never had before. °· You feel your baby’s head pushing down and causing pelvic pressure. °· Your baby is not moving inside you as much as it used to. °Summary °· Contractions that occur before labor are   called Braxton Hicks contractions, false labor, or practice contractions.  Braxton Hicks contractions are usually shorter, weaker, farther apart, and less regular than true labor contractions. True labor contractions usually become progressively stronger and regular and they become more frequent.  Manage discomfort from  [facility] contractions by changing position, resting in a warm bath, drinking plenty of water, or practicing deep breathing. This information is not intended to replace advice given to you by your health care provider. Make sure you discuss any questions you have with your health care provider. Document Released: 11/29/2005 Document Revised: 10/18/2016 Document Reviewed: 10/18/2016 Elsevier Interactive Patient Education  2017 [employer].   Preterm Labor and Birth Information Pregnancy normally lasts 39-41 weeks. Preterm labor is when labor starts early. It starts before you have been pregnant for 37 whole weeks. What are the risk factors for preterm labor? Preterm labor is more likely to occur in women who:  Have an infection while pregnant.  Have a cervix that is short.  Have gone into preterm labor before.  Have had surgery on their cervix.  Are younger than age 45.  Are older than age 66.  Are African American.  Are pregnant with two or more babies.  Take street drugs while pregnant.  Smoke while pregnant.  Do not gain enough weight while pregnant.  Got pregnant right after another pregnancy.  What are the symptoms of preterm labor? Symptoms of preterm labor include:  Cramps. The cramps may feel like the cramps some women get during their period. The cramps may happen with watery poop (diarrhea).  Pain in the belly (abdomen).  Pain in the lower back.  Regular contractions or tightening. It may feel like your belly is getting tighter.  Pressure in the lower belly that seems to get stronger.  More fluid (discharge) leaking from the vagina. The fluid may be watery or bloody.  Water breaking.  Why is it important to notice signs of preterm labor? Babies who are born early may not be fully developed. They have a higher chance for:  Long-term heart problems.  Long-term lung problems.  Trouble controlling body systems, like breathing.  Bleeding in the  brain.  A condition called cerebral palsy.  Learning difficulties.  Death.  These risks are highest for babies who are born before 34 weeks of pregnancy. How is preterm labor treated? Treatment depends on:  How long you were pregnant.  Your condition.  The health of your baby.  Treatment may involve:  Having a stitch (suture) placed in your cervix. When you give birth, your cervix opens so the baby can come out. The stitch keeps the cervix from opening too soon.  Staying at the hospital.  Taking or getting medicines, such as: ? Hormone medicines. ? Medicines to stop contractions. ? Medicines to help the babys lungs develop. ? Medicines to prevent your baby from having cerebral palsy.  What should I do if I am in preterm labor? If you think you are going into labor too soon, call your doctor right away. How can I prevent preterm labor?  Do not use any tobacco products. ? Examples of these are cigarettes, chewing tobacco, and e-cigarettes. ? If you need help quitting, ask your doctor.  Do not use street drugs.  Do not use any medicines unless you ask your doctor if they are safe for you.  Talk with your doctor before taking any herbal supplements.  Make sure you gain enough weight.  Watch for infection. If you think  you might have an infection, get it checked right away.  If you have gone into preterm labor before, tell your doctor. This information is not intended to replace advice given to you by your health care provider. Make sure you discuss any questions you have with your health care provider. Document Released: 02/25/2009 Document Revised: 05/11/2016 Document Reviewed: 04/21/2016 Elsevier Interactive Patient Education  2018 Elsevier Inc.  Fetal Movement Counts Patient Name: ________________________________________________ Patient Due Date: ____________________ What is a fetal movement count? A fetal movement count is the number of times that you feel  your baby move during a certain amount of time. This may also be called a fetal kick count. A fetal movement count is recommended for every pregnant woman. You may be asked to start counting fetal movements as early as week 28 of your pregnancy. Pay attention to when your baby is most active. You may notice your baby's sleep and wake cycles. You may also notice things that make your baby move more. You should do a fetal movement count: When your baby is normally most active. At the same time each day.  A good time to count movements is while you are resting, after having something to eat and drink. How do I count fetal movements? Find a quiet, comfortable area. Sit, or lie down on your side. Write down the date, the start time and stop time, and the number of movements that you felt between those two times. Take this information with you to your health care visits. For 2 hours, count kicks, flutters, swishes, rolls, and jabs. You should feel at least 10 movements during 2 hours. You may stop counting after you have felt 10 movements. If you do not feel 10 movements in 2 hours, have something to eat and drink. Then, keep resting and counting for 1 hour. If you feel at least 4 movements during that hour, you may stop counting. Contact a health care provider if: You feel fewer than 4 movements in 2 hours. Your baby is not moving like he or she usually does. Date: ____________ Start time: ____________ Stop time: ____________ Movements: ____________ Date: ____________ Start time: ____________ Stop time: ____________ Movements: ____________ Date: ____________ Start time: ____________ Stop time: ____________ Movements: ____________ Date: ____________ Start time: ____________ Stop time: ____________ Movements: ____________ Date: ____________ Start time: ____________ Stop time: ____________ Movements: ____________ Date: ____________ Start time: ____________ Stop time: ____________ Movements:  ____________ Date: ____________ Start time: ____________ Stop time: ____________ Movements: ____________ Date: ____________ Start time: ____________ Stop time: ____________ Movements: ____________ Date: ____________ Start time: ____________ Stop time: ____________ Movements: ____________ This information is not intended to replace advice given to you by your health care provider. Make sure you discuss any questions you have with your health care provider. Document Released: 12/29/2006 Document Revised: 07/28/2016 Document Reviewed: 01/08/2016 Elsevier Interactive Patient Education  [employer]2018 Elsevier Inc.

## 2017-05-21 NOTE — Progress Notes (Signed)
Written and verbal d/c instructions given and understanding voiced. 

## 2017-07-08 ENCOUNTER — Inpatient Hospital Stay (HOSPITAL_COMMUNITY)
Admission: AD | Admit: 2017-07-08 | Discharge: 2017-07-08 | Disposition: A | Discharge status: Home / Self Care | Payer: BC Managed Care – PPO | Source: Ambulatory Visit | Attending: Obstetrics and Gynecology | Admitting: Obstetrics and Gynecology

## 2017-07-08 ENCOUNTER — Encounter (HOSPITAL_COMMUNITY): Payer: Self-pay

## 2017-07-08 DIAGNOSIS — Z79899 Other long term (current) drug therapy: Secondary | ICD-10-CM | POA: Diagnosis not present

## 2017-07-08 DIAGNOSIS — R51 Headache: Secondary | ICD-10-CM

## 2017-07-08 DIAGNOSIS — O26893 Other specified pregnancy related conditions, third trimester: Secondary | ICD-10-CM | POA: Diagnosis not present

## 2017-07-08 DIAGNOSIS — H538 Other visual disturbances: Secondary | ICD-10-CM | POA: Diagnosis not present

## 2017-07-08 DIAGNOSIS — O368130 Decreased fetal movements, third trimester, not applicable or unspecified: Secondary | ICD-10-CM | POA: Diagnosis not present

## 2017-07-08 DIAGNOSIS — Z3689 Encounter for other specified antenatal screening: Secondary | ICD-10-CM

## 2017-07-08 DIAGNOSIS — Z3A38 38 weeks gestation of pregnancy: Secondary | ICD-10-CM | POA: Insufficient documentation

## 2017-07-08 HISTORY — DX: Endometriosis, unspecified: N80.9

## 2017-07-08 LAB — COMPREHENSIVE METABOLIC PANEL
ALT: 18 U/L (ref 14–54)
AST: 27 U/L (ref 15–41)
Albumin: 3.2 g/dL — ABNORMAL LOW (ref 3.5–5.0)
Alkaline Phosphatase: 209 U/L — ABNORMAL HIGH (ref 38–126)
Anion gap: 7 (ref 5–15)
BILIRUBIN TOTAL: 0.7 mg/dL (ref 0.3–1.2)
BUN: 8 mg/dL (ref 6–20)
CHLORIDE: 106 mmol/L (ref 101–111)
CO2: 24 mmol/L (ref 22–32)
CREATININE: 0.6 mg/dL (ref 0.44–1.00)
Calcium: 8.7 mg/dL — ABNORMAL LOW (ref 8.9–10.3)
Glucose, Bld: 81 mg/dL (ref 65–99)
POTASSIUM: 3.6 mmol/L (ref 3.5–5.1)
Sodium: 137 mmol/L (ref 135–145)
TOTAL PROTEIN: 7 g/dL (ref 6.5–8.1)

## 2017-07-08 LAB — PROTEIN / CREATININE RATIO, URINE
CREATININE, URINE: 270 mg/dL
Protein Creatinine Ratio: 0.13 mg/mg{Cre} (ref 0.00–0.15)
TOTAL PROTEIN, URINE: 34 mg/dL

## 2017-07-08 LAB — CBC
HCT: 36 % (ref 36.0–46.0)
Hemoglobin: 11.9 g/dL — ABNORMAL LOW (ref 12.0–15.0)
MCH: 29.6 pg (ref 26.0–34.0)
MCHC: 33.1 g/dL (ref 30.0–36.0)
MCV: 89.6 fL (ref 78.0–100.0)
PLATELETS: 217 10*3/uL (ref 150–400)
RBC: 4.02 MIL/uL (ref 3.87–5.11)
RDW: 13.5 % (ref 11.5–15.5)
WBC: 9.3 10*3/uL (ref 4.0–10.5)

## 2017-07-08 MED ORDER — ACETAMINOPHEN 500 MG PO TABS
1000.0000 mg | ORAL_TABLET | Freq: Once | ORAL | Status: AC
  Administered 2017-07-08: 1000 mg via ORAL
  Filled 2017-07-08: qty 2

## 2017-07-08 NOTE — MAU Note (Signed)
Patient was sent by Dr. Renaldo FiddlerAdkins for evaluation of PIH, having headaches, vision changes and decreased fetal movement.

## 2017-07-08 NOTE — MAU Provider Note (Signed)
History     CSN: [id number]  Arrival date and time: 07/08/17 1328  First Provider Initiated Contact with Patient 07/08/17 1410      Chief Complaint  Patient presents with  . Decreased Fetal Movement  . Headache  . Blurred Vision   HPI Geanie Berlinshley J Armor is a 31 y.o. G1P0 at 1936w6d who presents with headache & decreased fetal movement. Patient seen in office today for problem visit & [facility]/t headache & blurred vision. Symptoms began this morning. Reports initially noted decrease in vision & floaters in left eye as well as blurred vision in right eye. Headache presented a few hours after blurred vision. Reports frontal headache that she describes as achy & rates 5/10. Has not treated. Since being seen in the office, reports improvement in visual changes. Denies epigastric pain, CP, SOB. No history of hypertension.  Decreased fetal movement since this morning. Reports feeling baby move 5 times this morning & increased since being placed on monitor in MAU. OB History    Gravida Para Term Preterm AB Living   1 0           SAB TAB Ectopic Multiple Live Births                  Past Medical History:  Diagnosis Date  . Dizziness   . Endometriosis   . Heart palpitations     Past Surgical History:  Procedure Laterality Date  . APPENDECTOMY     dr Lovell Sheehanjenkins 1999  . CHOLECYSTECTOMY  06/05/2012   Procedure: LAPAROSCOPIC CHOLECYSTECTOMY;  Surgeon: Marlane HatcherWilliam S Bradford, MD;  Location: [facility];  Service: General;  Laterality: N/A;  . PELVIC LAPAROSCOPY  2017   Endometriosis    Family History  Problem Relation Age of Onset  . Hypertension Father   . Diabetes Maternal Grandmother   . Heart disease Maternal Grandmother   . Hypertension Paternal Grandfather   . Breast cancer Paternal Grandmother [id number]    Social History  Substance Use Topics  . Smoking status: Never Smoker  . Smokeless tobacco: Never Used  . Alcohol use No    Allergies:  Allergies  Allergen  Reactions  . Ceclor [Cefaclor] Hives    Childhood allergy  . Promethazine Hcl Other (See Comments)    hyperactivity  . Latex Rash    Rash when using latex gloves    Prescriptions Prior to Admission  Medication Sig Dispense Refill Last Dose  . calcium carbonate (TUMS - DOSED IN MG ELEMENTAL CALCIUM) 500 MG chewable tablet Chew 1 tablet by mouth daily as needed for indigestion or heartburn.   07/07/2017 at Unknown time  . Multiple Vitamins-Minerals (ADULT ONE DAILY GUMMIES) CHEW Chew 2 tablets by mouth daily.   Past Week at Unknown time    Review of Systems  Constitutional: Negative.   Eyes: Positive for visual disturbance. Negative for photophobia.  Respiratory: Negative.   Cardiovascular: Negative.   Gastrointestinal: Negative.   Genitourinary: Negative.   Neurological: Positive for headaches.   Physical Exam   Blood pressure 121/75, pulse 89, temperature 98 F (36.7 C), temperature source Oral, resp. rate 16, last menstrual period 10/09/2016.  Patient Vitals for the past 24 hrs:  BP Temp Temp src Pulse Resp  07/08/17 1431 107/72 - - 93 -  07/08/17 1416 106/72 - - 93 -  07/08/17 1401 113/67 - - (!) 119 -  07/08/17 1355 121/75 98 F (36.7 C) Oral 89 16    Physical Exam  Nursing note and vitals reviewed. Constitutional: She is oriented to person, place, and time. She appears well-developed and well-nourished. No distress.  HENT:  Head: Normocephalic and atraumatic.  Eyes: Conjunctivae are normal. Right eye exhibits no discharge. Left eye exhibits no discharge. No scleral icterus.  Neck: Normal range of motion.  Cardiovascular: Normal rate, regular rhythm and normal heart sounds.   No murmur heard. Respiratory: Effort normal and breath sounds normal. No respiratory distress. She has no wheezes.  GI: Soft. Bowel sounds are normal. There is no tenderness.  Musculoskeletal: She exhibits no edema.  Neurological: She is alert and oriented to person, place, and time. She has  normal reflexes.  No clonus  Skin: Skin is warm and dry. She is not diaphoretic.  Psychiatric: She has a normal mood and affect. Her behavior is normal. Judgment and thought content normal.   Fetal Tracing:  Baseline: 125 Variability: moderate Accelerations: 15x15 Decelerations: none MAU Course  Procedures Results for orders placed or performed during the hospital encounter of 07/08/17 (from the past 24 hour(s))  CBC     Status: Abnormal   Collection Time: 07/08/17  1:42 PM  Result Value Ref Range   WBC 9.3 4.0 - 10.5 K/uL   RBC 4.02 3.87 - 5.11 MIL/uL   Hemoglobin 11.9 (L) 12.0 - 15.0 g/dL   HCT 45.436.0 09.836.0 - 11.946.0 %   MCV 89.6 78.0 - 100.0 fL   MCH 29.6 26.0 - 34.0 pg   MCHC 33.1 30.0 - 36.0 g/dL   RDW 14.713.5 82.911.5 - 56.215.5 %   Platelets 217 150 - 400 K/uL  Comprehensive metabolic panel     Status: Abnormal   Collection Time: 07/08/17  1:42 PM  Result Value Ref Range   Sodium 137 135 - 145 mmol/L   Potassium 3.6 3.5 - 5.1 mmol/L   Chloride 106 101 - 111 mmol/L   CO2 24 22 - 32 mmol/L   Glucose, Bld 81 65 - 99 mg/dL   BUN 8 6 - 20 mg/dL   Creatinine, Ser 1.300.60 0.44 - 1.00 mg/dL   Calcium 8.7 (L) 8.9 - 10.3 mg/dL   Total Protein 7.0 6.5 - 8.1 g/dL   Albumin 3.2 (L) 3.5 - 5.0 g/dL   AST 27 15 - 41 U/L   ALT 18 14 - 54 U/L   Alkaline Phosphatase 209 (H) 38 - 126 U/L   Total Bilirubin 0.7 0.3 - 1.2 mg/dL   GFR calc non Af Amer >60 >60 mL/min   GFR calc Af Amer >60 >60 mL/min   Anion gap 7 5 - 15  Protein / creatinine ratio, urine     Status: None   Collection Time: 07/08/17  1:50 PM  Result Value Ref Range   Creatinine, Urine 270.00 mg/dL   Total Protein, Urine 34 mg/dL   Protein Creatinine Ratio 0.13 0.00 - 0.15 mg/mg[Cre]    MDM Reactive NST CBC, CMP, urine PCR. BPs cycled; pt remains normotensive while in MAU.  Tylenol 1 gm PO -- pt reports improvement in headache Discussed with Dr. Renaldo FiddlerAdkins. Ok to discharge home.   Assessment and Plan  A: 1. Headache in pregnancy,  third trimester   2. Decreased fetal movements in third trimester, single or unspecified fetus   3. NST (non-stress test) reactive    P; Discharge home Discussed reasons to return to MAU including s/s preeclampsia Fetal kick form Keep f/u with OB  Judeth HornErin Leonor Darnell 07/08/2017, 2:09 PM

## 2017-07-08 NOTE — Discharge Instructions (Signed)

## 2017-07-08 NOTE — MAU Note (Signed)
Lab drawing pt, will be straight back to rm

## 2017-07-14 ENCOUNTER — Encounter (HOSPITAL_COMMUNITY): Payer: Self-pay | Admitting: *Deleted

## 2017-07-14 ENCOUNTER — Telehealth (HOSPITAL_COMMUNITY): Payer: Self-pay | Admitting: *Deleted

## 2017-07-14 LAB — OB RESULTS CONSOLE GBS: GBS: NEGATIVE

## 2017-07-14 NOTE — Telephone Encounter (Signed)
Preadmission screen  

## 2017-07-15 ENCOUNTER — Inpatient Hospital Stay (HOSPITAL_COMMUNITY)
Admission: AD | Admit: 2017-07-15 | Discharge: 2017-07-18 | DRG: 774 | Disposition: A | Discharge status: Home / Self Care | Payer: BC Managed Care – PPO | Source: Ambulatory Visit | Attending: Obstetrics and Gynecology | Admitting: Obstetrics and Gynecology

## 2017-07-15 ENCOUNTER — Encounter (HOSPITAL_COMMUNITY): Payer: Self-pay | Admitting: *Deleted

## 2017-07-15 DIAGNOSIS — Z3A40 40 weeks gestation of pregnancy: Secondary | ICD-10-CM

## 2017-07-15 DIAGNOSIS — O324XX0 Maternal care for high head at term, not applicable or unspecified: Secondary | ICD-10-CM | POA: Diagnosis present

## 2017-07-15 DIAGNOSIS — Z3493 Encounter for supervision of normal pregnancy, unspecified, third trimester: Secondary | ICD-10-CM | POA: Diagnosis present

## 2017-07-15 HISTORY — DX: Adverse effect of unspecified anesthetic, initial encounter: T41.45XA

## 2017-07-15 HISTORY — DX: Other complications of anesthesia, initial encounter: T88.59XA

## 2017-07-15 LAB — CBC
HCT: 35.8 % — ABNORMAL LOW (ref 36.0–46.0)
Hemoglobin: 11.8 g/dL — ABNORMAL LOW (ref 12.0–15.0)
MCH: 29.2 pg (ref 26.0–34.0)
MCHC: 33 g/dL (ref 30.0–36.0)
MCV: 88.6 fL (ref 78.0–100.0)
PLATELETS: 232 10*3/uL (ref 150–400)
RBC: 4.04 MIL/uL (ref 3.87–5.11)
RDW: 13.5 % (ref 11.5–15.5)
WBC: 12.9 10*3/uL — AB (ref 4.0–10.5)

## 2017-07-15 MED ORDER — OXYTOCIN BOLUS FROM INFUSION
500.0000 mL | Freq: Once | INTRAVENOUS | Status: AC
  Administered 2017-07-16: 500 mL via INTRAVENOUS

## 2017-07-15 MED ORDER — LACTATED RINGERS IV SOLN
500.0000 mL | INTRAVENOUS | Status: DC | PRN
  Administered 2017-07-16 (×2): 500 mL via INTRAVENOUS

## 2017-07-15 MED ORDER — ACETAMINOPHEN 325 MG PO TABS: 650.0000 mg | ORAL_TABLET | ORAL | Status: DC | PRN

## 2017-07-15 MED ORDER — LACTATED RINGERS IV SOLN
INTRAVENOUS | Status: DC
  Administered 2017-07-15 – 2017-07-16 (×2): via INTRAVENOUS

## 2017-07-15 MED ORDER — OXYCODONE-ACETAMINOPHEN 5-325 MG PO TABS: 2.0000 | ORAL_TABLET | ORAL | Status: DC | PRN

## 2017-07-15 MED ORDER — LIDOCAINE HCL (PF) 1 % IJ SOLN
30.0000 mL | INTRAMUSCULAR | Status: DC | PRN
  Filled 2017-07-15: qty 30

## 2017-07-15 MED ORDER — OXYCODONE-ACETAMINOPHEN 5-325 MG PO TABS: 1.0000 | ORAL_TABLET | ORAL | Status: DC | PRN

## 2017-07-15 MED ORDER — FLEET ENEMA 7-19 GM/118ML RE ENEM: 1.0000 | ENEMA | RECTAL | Status: DC | PRN

## 2017-07-15 MED ORDER — OXYTOCIN 40 UNITS IN LACTATED RINGERS INFUSION - SIMPLE MED
2.5000 [IU]/h | INTRAVENOUS | Status: DC
  Administered 2017-07-16: 2.5 [IU]/h via INTRAVENOUS

## 2017-07-15 MED ORDER — ONDANSETRON HCL 4 MG/2ML IJ SOLN: 4.0000 mg | Freq: Four times a day (QID) | INTRAMUSCULAR | Status: DC | PRN

## 2017-07-15 MED ORDER — SOD CITRATE-CITRIC ACID 500-334 MG/5ML PO SOLN: 30.0000 mL | ORAL | Status: DC | PRN

## 2017-07-15 NOTE — MAU Note (Signed)
Pt reports having ctx since yesterday. More intense and closer together after dinner about 5 min apart now. Reports some show and good fetal movement.

## 2017-07-16 ENCOUNTER — Encounter (HOSPITAL_COMMUNITY): Payer: Self-pay | Admitting: *Deleted

## 2017-07-16 ENCOUNTER — Inpatient Hospital Stay (HOSPITAL_COMMUNITY): Payer: BC Managed Care – PPO | Admitting: Anesthesiology

## 2017-07-16 LAB — TYPE AND SCREEN
ABO/RH(D): O POS
Antibody Screen: NEGATIVE

## 2017-07-16 LAB — SYPHILIS: RPR W/REFLEX TO RPR TITER AND TREPONEMAL ANTIBODIES, TRADITIONAL SCREENING AND DIAGNOSIS ALGORITHM: RPR Ser Ql: NONREACTIVE

## 2017-07-16 LAB — ABO/RH: ABO/RH(D): O POS

## 2017-07-16 MED ORDER — TETANUS-DIPHTH-ACELL PERTUSSIS 5-2.5-18.5 LF-MCG/0.5 IM SUSP: 0.5000 mL | Freq: Once | INTRAMUSCULAR | Status: DC

## 2017-07-16 MED ORDER — EPHEDRINE 5 MG/ML INJ: 10.0000 mg | INTRAVENOUS | Status: DC | PRN

## 2017-07-16 MED ORDER — WITCH HAZEL-GLYCERIN EX PADS: 1.0000 "application " | MEDICATED_PAD | CUTANEOUS | Status: DC | PRN

## 2017-07-16 MED ORDER — OXYTOCIN 40 UNITS IN LACTATED RINGERS INFUSION - SIMPLE MED
1.0000 m[IU]/min | INTRAVENOUS | Status: DC
  Administered 2017-07-16: 2 m[IU]/min via INTRAVENOUS
  Filled 2017-07-16: qty 1000

## 2017-07-16 MED ORDER — BENZOCAINE-MENTHOL 20-0.5 % EX AERO
1.0000 "application " | INHALATION_SPRAY | CUTANEOUS | Status: DC | PRN
  Administered 2017-07-17 – 2017-07-18 (×2): 1 via TOPICAL
  Filled 2017-07-16 (×2): qty 56

## 2017-07-16 MED ORDER — ONDANSETRON HCL 4 MG/2ML IJ SOLN: 4.0000 mg | INTRAMUSCULAR | Status: DC | PRN

## 2017-07-16 MED ORDER — PHENYLEPHRINE 40 MCG/ML (10ML) SYRINGE FOR IV PUSH (FOR BLOOD PRESSURE SUPPORT)
80.0000 ug | PREFILLED_SYRINGE | INTRAVENOUS | Status: DC | PRN
  Filled 2017-07-16: qty 5

## 2017-07-16 MED ORDER — ACETAMINOPHEN 325 MG PO TABS
650.0000 mg | ORAL_TABLET | ORAL | Status: DC | PRN
  Administered 2017-07-16 – 2017-07-18 (×3): 650 mg via ORAL
  Filled 2017-07-16 (×4): qty 2

## 2017-07-16 MED ORDER — OXYCODONE HCL 5 MG PO TABS: 10.0000 mg | ORAL_TABLET | ORAL | Status: DC | PRN

## 2017-07-16 MED ORDER — FENTANYL 2.5 MCG/ML BUPIVACAINE 1/10 % EPIDURAL INFUSION (WH - ANES)
14.0000 mL/h | INTRAMUSCULAR | Status: DC | PRN
  Administered 2017-07-16: 14 mL/h via EPIDURAL

## 2017-07-16 MED ORDER — LACTATED RINGERS IV SOLN
500.0000 mL | Freq: Once | INTRAVENOUS | Status: AC
  Administered 2017-07-16: 500 mL via INTRAVENOUS

## 2017-07-16 MED ORDER — PHENYLEPHRINE 40 MCG/ML (10ML) SYRINGE FOR IV PUSH (FOR BLOOD PRESSURE SUPPORT): 80.0000 ug | PREFILLED_SYRINGE | INTRAVENOUS | Status: DC | PRN

## 2017-07-16 MED ORDER — LACTATED RINGERS IV SOLN: 500.0000 mL | Freq: Once | INTRAVENOUS | Status: DC

## 2017-07-16 MED ORDER — SENNOSIDES-DOCUSATE SODIUM 8.6-50 MG PO TABS
2.0000 | ORAL_TABLET | ORAL | Status: DC
Start: 2017-07-17 — End: 2017-07-18
  Administered 2017-07-16: 2 via ORAL
  Filled 2017-07-16: qty 2

## 2017-07-16 MED ORDER — DIBUCAINE 1 % RE OINT: 1.0000 "application " | TOPICAL_OINTMENT | RECTAL | Status: DC | PRN

## 2017-07-16 MED ORDER — EPHEDRINE 5 MG/ML INJ
10.0000 mg | INTRAVENOUS | Status: DC | PRN
  Filled 2017-07-16: qty 2

## 2017-07-16 MED ORDER — PHENYLEPHRINE 40 MCG/ML (10ML) SYRINGE FOR IV PUSH (FOR BLOOD PRESSURE SUPPORT)
80.0000 ug | PREFILLED_SYRINGE | INTRAVENOUS | Status: DC | PRN
  Administered 2017-07-16: 80 ug via INTRAVENOUS
  Filled 2017-07-16: qty 5

## 2017-07-16 MED ORDER — ZOLPIDEM TARTRATE 5 MG PO TABS: 5.0000 mg | ORAL_TABLET | Freq: Every evening | ORAL | Status: DC | PRN

## 2017-07-16 MED ORDER — IBUPROFEN 600 MG PO TABS
600.0000 mg | ORAL_TABLET | Freq: Four times a day (QID) | ORAL | Status: DC
  Administered 2017-07-16 – 2017-07-18 (×6): 600 mg via ORAL
  Filled 2017-07-16 (×8): qty 1

## 2017-07-16 MED ORDER — HYDROMORPHONE HCL 1 MG/ML IJ SOLN: 1.0000 mg | INTRAMUSCULAR | Status: DC | PRN

## 2017-07-16 MED ORDER — DIPHENHYDRAMINE HCL 25 MG PO CAPS: 25.0000 mg | ORAL_CAPSULE | Freq: Four times a day (QID) | ORAL | Status: DC | PRN

## 2017-07-16 MED ORDER — FENTANYL 2.5 MCG/ML BUPIVACAINE 1/10 % EPIDURAL INFUSION (WH - ANES)
INTRAMUSCULAR | Status: AC
  Filled 2017-07-16: qty 100

## 2017-07-16 MED ORDER — OXYCODONE HCL 5 MG PO TABS: 5.0000 mg | ORAL_TABLET | ORAL | Status: DC | PRN

## 2017-07-16 MED ORDER — SIMETHICONE 80 MG PO CHEW: 80.0000 mg | CHEWABLE_TABLET | ORAL | Status: DC | PRN

## 2017-07-16 MED ORDER — DIPHENHYDRAMINE HCL 50 MG/ML IJ SOLN: 12.5000 mg | INTRAMUSCULAR | Status: DC | PRN

## 2017-07-16 MED ORDER — COCONUT OIL OIL
1.0000 "application " | TOPICAL_OIL | Status: DC | PRN
  Filled 2017-07-16: qty 120

## 2017-07-16 MED ORDER — LIDOCAINE HCL (PF) 1 % IJ SOLN
INTRAMUSCULAR | Status: DC | PRN
  Administered 2017-07-16 (×2): 6 mL via EPIDURAL

## 2017-07-16 MED ORDER — TERBUTALINE SULFATE 1 MG/ML IJ SOLN
0.2500 mg | Freq: Once | INTRAMUSCULAR | Status: DC | PRN
  Filled 2017-07-16: qty 1

## 2017-07-16 MED ORDER — SODIUM CHLORIDE 0.9% FLUSH
INTRAVENOUS | Status: AC
  Filled 2017-07-16: qty 6

## 2017-07-16 MED ORDER — PHENYLEPHRINE 40 MCG/ML (10ML) SYRINGE FOR IV PUSH (FOR BLOOD PRESSURE SUPPORT)
PREFILLED_SYRINGE | INTRAVENOUS | Status: AC
  Administered 2017-07-16: 80 ug via INTRAVENOUS
  Filled 2017-07-16: qty 20

## 2017-07-16 MED ORDER — PRENATAL MULTIVITAMIN CH
1.0000 | ORAL_TABLET | Freq: Every day | ORAL | Status: DC
  Administered 2017-07-17: 1 via ORAL
  Filled 2017-07-16 (×2): qty 1

## 2017-07-16 MED ORDER — ONDANSETRON HCL 4 MG PO TABS: 4.0000 mg | ORAL_TABLET | ORAL | Status: DC | PRN

## 2017-07-16 NOTE — Anesthesia Postprocedure Evaluation (Signed)
Anesthesia Post Note  Patient: Maria Walton  Procedure(s) Performed: * No procedures listed *     Patient location during evaluation: Mother Baby Anesthesia Type: Epidural Level of consciousness: awake Pain management: pain level controlled Vital Signs Assessment: post-procedure vital signs reviewed and stable Respiratory status: spontaneous breathing Cardiovascular status: stable Postop Assessment: no headache, no backache, epidural receding, patient able to bend at knees, no signs of nausea or vomiting and adequate PO intake Anesthetic complications: no    Last Vitals:  Vitals:   07/16/17 1532 07/16/17 1559  BP: (!) 118/96 115/65  Pulse: 90 (!) 115  Resp: 18 17  Temp:  37.5 C    Last Pain:  Vitals:   07/16/17 1559  TempSrc: Oral  PainSc: 0-No pain   Pain Goal: Patients Stated Pain Goal: 2 (07/16/17 0011)               Fanny DanceMULLINS,Zanden Colver

## 2017-07-16 NOTE — Anesthesia Procedure Notes (Signed)
Epidural Patient location during procedure: OB Start time: 07/16/2017 6:15 AM End time: 07/16/2017 6:52 AM  Staffing Anesthesiologist: Jairo BenJACKSON, Lanah Steines Performed: anesthesiologist   Preanesthetic Checklist Completed: patient identified, surgical consent, pre-op evaluation, timeout performed, IV checked, risks and benefits discussed and monitors and equipment checked  Epidural Patient position: sitting Prep: site prepped and draped and DuraPrep Patient monitoring: blood pressure, continuous pulse ox and heart rate Approach: midline Location: L3-L4 Injection technique: LOR air  Needle:  Needle type: Tuohy  Needle gauge: 17 G Needle length: 9 cm Needle insertion depth: 4.5 cm Catheter type: closed end flexible Catheter size: 19 Gauge Catheter at skin depth: 11 cm Test dose: negative (1% lidocaine)  Assessment Events: blood not aspirated, injection not painful, no injection resistance, negative IV test and no paresthesia  Additional Notes Pt identified in Labor room.  Monitors applied. Working IV access confirmed. Sterile prep, drape lumbar spine.  1% lido local L 3,4.  #17ga Touhy LOR air at 4.5 cm L 3,4, cath in easily to 11 cm skin. Test dose OK, cath dosed and infusion begun.  Patient asymptomatic, VSS, no heme aspirated, tolerated well.  Sandford Craze Mako Pelfrey, MDReason for block:procedure for pain

## 2017-07-16 NOTE — Progress Notes (Signed)
9.5/c/+1-2, LOA Anticipate second stage soon.

## 2017-07-16 NOTE — Lactation Note (Signed)
This note was copied from a baby's chart. Lactation Consultation Note; Infant is 3 hours old and has had one attempt to breastfeed. Mother reports that infant fell asleep when he was placed skin to skin with her. Assist mother with hand expression and observed several drops of colostrum. Assist mother with placing infant in football hold. Infant latched on quickly. Mother became dizzy and placed mother in a laid back position. Infant sustained latch for 15-20 mins. Observed frequent suckling and a few swallows. Lots of teaching with parents . Reviewed cue card and advised mother to feed infant 8-12 times in 24 hours. [facility] on [facility].   Patient Name: Maria Earnstine Regalshley Bisaillon [id number]'s Date: 07/16/2017 Reason for consult: Initial assessment   Maternal Data Has patient been taught Hand Expression?: Yes Does the patient have breastfeeding experience prior to this delivery?: No  Feeding Feeding Type: Breast Fed Length of feed: 15 min (infant sustained latch for 15-20 mins.)  LATCH Score Latch: Grasps breast easily, tongue down, lips flanged, rhythmical sucking.  Audible Swallowing: A few with stimulation  Type of Nipple: Everted at rest and after stimulation  Comfort (Breast/Nipple): Soft / non-tender  Hold (Positioning): Assistance needed to correctly position infant at breast and maintain latch.  LATCH Score: 8  Interventions Interventions: Breast feeding basics reviewed;Assisted with latch;Hand express;Breast compression;Adjust position;Support pillows  Lactation Tools Discussed/Used     Consult Status Consult Status: Follow-up Date: 07/17/17 Follow-up type: In-patient    Stevan BornKendrick, Maria Walton 07/16/2017, 6:12 PM

## 2017-07-16 NOTE — Anesthesia Pain Management Evaluation Note (Signed)
  CRNA Pain Management Visit Note  Patient: Maria Walton, 31 y.o., female  "Hello [facility]. We have an anesthesia team available at all times to provide care throughout the hospital, including epidural management and anesthesia for C-section. I don't know your plan for the delivery whether it a natural birth, water birth, IV sedation, nitrous supplementation, doula or epidural, but we want to meet your pain goals."   1.Was your pain managed to your expectations on prior hospitalizations?   No prior hospitalizations  2.What is your expectation for pain management during this hospitalization?     Epidural  3.How can we help you reach that goal? epidural  Record the patient's initial score and the patient's pain goal.   Pain: 2  Pain Goal: 4 [facility] wants you to be able to say your pain was always managed very well.  Maria Walton 07/16/2017

## 2017-07-16 NOTE — Progress Notes (Signed)
C/c/+2, pushing    Rosie FateE Cinzia Devos MD

## 2017-07-16 NOTE — H&P (Signed)
Geanie Berlinshley J Tith is a 31 y.o. female presenting for labor. OB History    Gravida Para Term Preterm AB Living   1 0           SAB TAB Ectopic Multiple Live Births                 Past Medical History:  Diagnosis Date  . Complication of anesthesia    N/V; rash  . Dizziness   . Endometriosis   . Heart palpitations    Past Surgical History:  Procedure Laterality Date  . APPENDECTOMY     dr Lovell Sheehanjenkins 1999  . CHOLECYSTECTOMY  06/05/2012   Procedure: LAPAROSCOPIC CHOLECYSTECTOMY;  Surgeon: Marlane HatcherWilliam S Bradford, MD;  Location: [facility];  Service: General;  Laterality: N/A;  . PELVIC LAPAROSCOPY  2017   Endometriosis   Family History: family history includes Breast cancer (age of onset: 3273) in her paternal grandmother; Diabetes in her maternal grandmother; Heart disease in her maternal grandmother; Hypertension in her father and paternal grandfather. Social History:  reports that she has never smoked. She has never used smokeless tobacco. She reports that she does not drink alcohol or use drugs.     Maternal Diabetes: No Genetic Screening: Normal Maternal Ultrasounds/Referrals: Normal Fetal Ultrasounds or other Referrals:  None Maternal Substance Abuse:  No Significant Maternal Medications:  None Significant Maternal Lab Results:  None Other Comments:  None  ROS History Dilation: 4 Effacement (%): 90 Station: -1 Exam by:: Dr.Akiva Josey Blood pressure 130/85, pulse (!) 248, temperature 97.9 F (36.6 C), temperature source Oral, resp. rate 16, height 5\' 7"  (1.702 m), weight 59 kg (130 lb 1.9 oz), last menstrual period 10/09/2016. Exam Physical Exam  Prenatal labs: ABO, Rh: --/--/O POS, O POS (08/03 2345) Antibody: NEG (08/03 2345) Rubella: Immune (01/08 0000) RPR: Nonreactive (01/08 0000)  HBsAg: Negative (01/08 0000)  HIV: Non-reactive (01/08 0000)  GBS: Negative (08/02 0000)   Assessment/Plan: 31 yo G1P0@40 .0wga presenting for labor.  AROM/pitocin. H/o abnl pap, lsc  endometriosis surgery & lsc chole otherwise uncomplicated GBS neg  Ranae PilaElise Jennifer Jolyssa Oplinger 07/16/2017, 6:04 AM

## 2017-07-16 NOTE — Anesthesia Preprocedure Evaluation (Addendum)
Anesthesia Evaluation  Patient identified by MRN, date of birth, ID band Patient awake    Reviewed: Allergy & Precautions, NPO status , Patient's Chart, lab work & pertinent test results  History of Anesthesia Complications Negative for: history of anesthetic complications  Airway Mallampati: II  TM Distance: >3 FB Neck ROM: Full    Dental  (+) Dental Advisory Given   Pulmonary neg pulmonary ROS,    breath sounds clear to auscultation       Cardiovascular negative cardio ROS   Rhythm:Regular Rate:Normal     Neuro/Psych  Headaches,    GI/Hepatic negative GI ROS, Neg liver ROS,   Endo/Other  negative endocrine ROS  Renal/GU negative Renal ROS     Musculoskeletal   Abdominal   Peds  Hematology plt 232K    Anesthesia Other Findings   Reproductive/Obstetrics (+) Pregnancy                             Anesthesia Physical  Anesthesia Plan  ASA: II  Anesthesia Plan: Epidural   Post-op Pain Management:    Induction:   PONV Risk Score and Plan: Treatment may vary due to age or medical condition  Airway Management Planned: Natural Airway  Additional Equipment:   Intra-op Plan:   Post-operative Plan:   Informed Consent: I have reviewed the patients History and Physical, chart, labs and discussed the procedure including the risks, benefits and alternatives for the proposed anesthesia with the patient or authorized representative who has indicated his/her understanding and acceptance.       Plan Discussed with:   Anesthesia Plan Comments: (Patient identified. Risks/Benefits/Options discussed with patient including but not limited to bleeding, infection, nerve damage, paralysis, failed block, incomplete pain control, headache, blood pressure changes, nausea, vomiting, reactions to medication both or allergic, itching and postpartum back pain. Confirmed with bedside nurse the  patient's most recent platelet count. Confirmed with patient that they are not currently taking any anticoagulation, have any bleeding history or any family history of bleeding disorders. Patient expressed understanding and wished to proceed. All questions were answered. )        Anesthesia Quick Evaluation  

## 2017-07-17 LAB — CBC
HEMATOCRIT: 29.2 % — AB (ref 36.0–46.0)
Hemoglobin: 9.8 g/dL — ABNORMAL LOW (ref 12.0–15.0)
MCH: 29.7 pg (ref 26.0–34.0)
MCHC: 33.6 g/dL (ref 30.0–36.0)
MCV: 88.5 fL (ref 78.0–100.0)
PLATELETS: 184 10*3/uL (ref 150–400)
RBC: 3.3 MIL/uL — ABNORMAL LOW (ref 3.87–5.11)
RDW: 13.8 % (ref 11.5–15.5)
WBC: 14.5 10*3/uL — ABNORMAL HIGH (ref 4.0–10.5)

## 2017-07-17 NOTE — Progress Notes (Signed)
RN called to room for assistance walking to bathroom due to patient feeling dizzy. When RN entered room patient was sitting on the bedside. Patient was able to walk to bathroom with stand by assist. Encouraged patient to order breakfast. Will continue to monitor.

## 2017-07-17 NOTE — Progress Notes (Signed)
Post Partum Day 1 s/p VAVD Subjective: up ad lib, voiding, tolerating PO and feels sore. Lochia appropriate. Able to walk without dizziness now.  Objective: Blood pressure (!) 101/57, pulse 68, temperature 97.7 F (36.5 C), temperature source Oral, resp. rate 18, height 5\' 7"  (1.702 m), weight 59 kg (130 lb 1.9 oz), last menstrual period 10/09/2016, SpO2 97 %, unknown if currently breastfeeding.  Physical Exam:  General: alert, cooperative and appears stated age Lochia: appropriate Uterine Fundus: firm Incision: healing well, no significant drainage, no dehiscence, no significant erythema DVT Evaluation: No evidence of DVT seen on physical exam. Negative Homan's sign. No cords or calf tenderness. No significant calf/ankle edema.   Recent Labs  07/15/17 2344 07/17/17 0510  HGB 11.8* 9.8*  HCT 35.8* 29.2*    Assessment/Plan: Plan for discharge tomorrow, Breastfeeding and Circumcision prior to discharge  Circ risks discussed including ifxn, bleeding, damage to penis and poor cosmesis. Signed consent and agrees to proceed.    LOS: 2 days   Ranae Pilalise Jennifer Karisa Nesser 07/17/2017, 10:44 AM

## 2017-07-18 ENCOUNTER — Inpatient Hospital Stay (HOSPITAL_COMMUNITY): Admission: RE | Admit: 2017-07-18 | Payer: BC Managed Care – PPO | Source: Ambulatory Visit

## 2017-07-18 NOTE — Discharge Summary (Signed)
Obstetric Discharge Summary Reason for Admission: onset of labor Prenatal Procedures: none Intrapartum Procedures: vacuum Postpartum Procedures: none Complications-Operative and Postpartum: 2 degree perineal laceration Hemoglobin  Date Value Ref Range Status  07/17/2017 9.8 (L) 12.0 - 15.0 g/dL Final   HCT  Date Value Ref Range Status  07/17/2017 29.2 (L) 36.0 - 46.0 % Final    Physical Exam:  General: alert, cooperative, appears stated age and no distress Lochia: appropriate Uterine Fundus: firm Incision: healing well DVT Evaluation: No evidence of DVT seen on physical exam.  Discharge Diagnoses: Term Pregnancy-delivered  Discharge Information: Date: 07/18/2017 Activity: pelvic rest Diet: routine Medications: None Condition: stable Instructions: refer to practice specific booklet Discharge to: home   Newborn Data: Live born female has had circ Birth Weight: 7 lb 14.6 oz (3589 g) APGAR: 6, 8  Home with mother.  Avital Dancy C 07/18/2017, 7:39 AM

## 2017-07-18 NOTE — Lactation Note (Signed)
This note was copied from a baby's chart. Lactation Consultation Note New mom having diffuclty latching. RN assisted in latch. Adjust baby's position towards breast. Encouraged to call for assist w/latch. Patient Name: Maria Walton Maria Walton MVHQI'OToday's Date: 07/18/2017 Reason for consult: Follow-up assessment   Maternal Data    Feeding Feeding Type: Breast Fed Length of feed: 5 min (still BF)  LATCH Score Latch: Repeated attempts needed to sustain latch, nipple held in mouth throughout feeding, stimulation needed to elicit sucking reflex.  Audible Swallowing: None  Type of Nipple: Everted at rest and after stimulation  Comfort (Breast/Nipple): Filling, red/small blisters or bruises, mild/mod discomfort  Hold (Positioning): Assistance needed to correctly position infant at breast and maintain latch.  LATCH Score: 5  Interventions    Lactation Tools Discussed/Used     Consult Status Consult Status: Follow-up Date: 07/18/17 Follow-up type: In-patient    [name], Diamond NickelLAURA G 07/18/2017, 2:49 AM

## 2017-07-18 NOTE — Lactation Note (Signed)
This note was copied from a baby's chart. Lactation Consultation Note  Patient Name: Maria Walton RUEAV'WToday's Date: 07/18/2017 Reason for consult: Follow-up assessment Baby at 46 hr of life. Mom reports baby is latching often and she has bilateral nipple pain. No skin break down or bruising noted. She stated some latches are "pinching" so she will take the baby off, reposition baby, and re latch the baby. She will also "pull his chin down" to stop the pinching. She was not sure she was going to bf so she did not look into getting a breast pump. Discussed options and gave a Harmony to take home. Discussed baby behavior, feeding frequency, baby belly size, voids, wt loss, breast changes, and nipple care. Mom stated she can manually express. She is aware of lactation services and support group. She will feed baby on demand 8+/24hr, post pump and offer expressed breast milk per volume guidelines as needed. She will f/u with lactation as needed.      Maternal Data    Feeding    LATCH Score                   Interventions    Lactation Tools Discussed/Used Pump Review: Setup, frequency, and cleaning;Milk Storage Initiated by:: ES Date initiated:: 07/18/17   Consult Status Consult Status: Complete    Rulon Eisenmengerlizabeth E Neeti Knudtson 07/18/2017, 12:27 PM

## 2018-01-25 DIAGNOSIS — Z3202 Encounter for pregnancy test, result negative: Secondary | ICD-10-CM | POA: Diagnosis not present

## 2018-01-25 DIAGNOSIS — R87610 Atypical squamous cells of undetermined significance on cytologic smear of cervix (ASC-US): Secondary | ICD-10-CM | POA: Diagnosis not present

## 2018-01-25 DIAGNOSIS — Z01419 Encounter for gynecological examination (general) (routine) without abnormal findings: Secondary | ICD-10-CM | POA: Diagnosis not present

## 2018-01-25 DIAGNOSIS — Z681 Body mass index (BMI) 19 or less, adult: Secondary | ICD-10-CM | POA: Diagnosis not present

## 2018-01-26 ENCOUNTER — Encounter: Payer: Self-pay | Admitting: Neurology

## 2018-02-02 MED FILL — DROSPIRENONE-EE 3-0.03 MG T: 3-0.03 | 84 days supply | Qty: 84 | Fill #0

## 2018-02-22 ENCOUNTER — Encounter: Payer: Self-pay | Admitting: Neurology

## 2018-02-22 ENCOUNTER — Ambulatory Visit: Payer: BC Managed Care – PPO | Admitting: Neurology

## 2018-02-22 VITALS — BP 90/60 | HR 107 | Resp 16 | Ht 67.75 in | Wt 108.8 lb

## 2018-02-22 DIAGNOSIS — R42 Dizziness and giddiness: Secondary | ICD-10-CM | POA: Diagnosis not present

## 2018-02-22 DIAGNOSIS — I951 Orthostatic hypotension: Secondary | ICD-10-CM

## 2018-02-22 DIAGNOSIS — G43829 Menstrual migraine, not intractable, without status migrainosus: Secondary | ICD-10-CM | POA: Diagnosis not present

## 2018-02-22 NOTE — Patient Instructions (Signed)
1.  For migraines, consider using a progesterone-based birth control or continuous hormone therapy.  Otherwise, I would recommend a daily preventative such as an antidepressant or topiramate. 2.  Consider magnesium citrate 400mg  daily, riboflavin (B2) 400mg  daily and coenzyme Q 10 100mg  three times daily 3.  Stay hydrated 4.  Do not skip meals 5.  Keep headache diary 6.  Limit pain relievers to no more than 2 days out of the week 7.  I would consider getting a tilt table test

## 2018-02-22 NOTE — Progress Notes (Signed)
NEUROLOGY CONSULTATION NOTE  Maria Berlinshley J Haggard MRN: [id number] DOB: Nov 25, 1986  Referring provider: Dr. Elon SpannerLeger Primary care provider: Dr. Margo AyeHall  Reason for consult:  Dizziness, migraines  HISTORY OF PRESENT ILLNESS: Maria Walton is a 32 year old female who presents for dizziness.  She is accompanied by her mother who supplements history.  History supplemented by OB/GYN note.  She began experiencing dizziness about 3 years ago.  She always feels lightheaded with sensation that she is going to pass out.  She has never passed out, however.  Her blood pressure typically runs around 90/60.  The dizziness improved during her pregnancy last year when her blood pressure was around 110-120/60s.  She is underweight with a BMI of 16.67.  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  She also has history of palpitations.  She underwent cardiac workup including cardiac and blood pressure monitoring, which was normal and was told to increase fluids and salt intake.  She is not anemic.  Blood sugar has been normal.  On occasion, she has episodes of different dizziness, specifically vertigo described as spinning sensation.  It may occur for a few hours and sometimes is a precursor for a migraine later.  She also has menstrual migraines.  She began having migraines in her early 3120s.  They became worse following the birth of her son last summer.  She reports moderate to severe throbbing retro-orbital pain or pain on top of her head, either side. It is often preceded by black spots in the vision of her left eye which continues with the headache.  She has associated nausea, photophobia and difficulty concentrating.  Sometimes she has vomiting.  They last 2 hour to several hours.  They usually occur 1-2 days prior to onset of menses and during her menses.  Sometimes they occur right after menses.  She experiences a postdrome sensation of being drunk  for a day.  There are no other triggers and rest helps relieve it.  She takes Tylenol.  She hasn't tried other pain relievers.  She has had changes to her birth control.  PAST MEDICAL HISTORY: Past Medical History:  Diagnosis Date  . Complication of anesthesia    N/V; rash  . Dizziness   . Endometriosis   . Heart palpitations     PAST SURGICAL HISTORY: Past Surgical History:  Procedure Laterality Date  . APPENDECTOMY     dr Lovell Sheehanjenkins 1999  . CHOLECYSTECTOMY  06/05/2012   Procedure: LAPAROSCOPIC CHOLECYSTECTOMY;  Surgeon: Marlane HatcherWilliam S Bradford, MD;  Location: [facility];  Service: General;  Laterality: N/A;  . PELVIC LAPAROSCOPY  2017   Endometriosis    MEDICATIONS: Current Outpatient Medications on File Prior to Visit  Medication Sig Dispense Refill  . Multiple Vitamins-Minerals (ADULT ONE DAILY GUMMIES) CHEW Chew 2 tablets by mouth daily.    . drospirenone-ethinyl estradiol (YASMIN,ZARAH,SYEDA) 3-0.03 MG tablet See admin instructions.  2   No current facility-administered medications on file prior to visit.     ALLERGIES: Allergies  Allergen Reactions  . Ceclor [Cefaclor] Hives    Childhood allergy  . Promethazine Hcl Other (See Comments)    hyperactivity  . Latex Rash    Rash when using latex gloves    FAMILY HISTORY: Family History  Problem Relation Age of Onset  . Hypertension Father   . Diabetes Maternal Grandmother   . Heart disease Maternal Grandmother   .  Hypertension Paternal Grandfather   . Breast cancer Paternal Grandmother 64    SOCIAL HISTORY: Social History   Socioeconomic History  . Marital status: Married    Spouse name: Not on file  . Number of children: Not on file  . Years of education: Not on file  . Highest education level: Not on file  Social Needs  . Financial resource strain: Not on file  . Food insecurity - worry: Not on file  . Food insecurity - inability: Not on file  . Transportation needs - medical: Not on file  . Transportation  needs - non-medical: Not on file  Occupational History  . Occupation: Charity fundraiser   Tobacco Use  . Smoking status: Never Smoker  . Smokeless tobacco: Never Used  Substance and Sexual Activity  . Alcohol use: No    Alcohol/week: 0.0 oz  . Drug use: No  . Sexual activity: Yes    Birth control/protection: None    Comment: intercourse age 26, sexual partners less than 5  Other Topics Concern  . Not on file  Social History Narrative  . Not on file    REVIEW OF SYSTEMS: Constitutional: No fevers, chills, or sweats, no generalized fatigue, change in appetite Eyes: No visual changes, double vision, eye pain Ear, nose and throat: No hearing loss, ear pain, nasal congestion, sore throat Cardiovascular: No chest pain, palpitations Respiratory:  No shortness of breath at rest or with exertion, wheezes GastrointestinaI: No nausea, vomiting, diarrhea, abdominal pain, fecal incontinence Genitourinary:  No dysuria, urinary retention or frequency Musculoskeletal:  No neck pain, back pain Integumentary: No rash, pruritus, skin lesions Neurological: as above Psychiatric: No depression, insomnia, anxiety Endocrine: No palpitations, fatigue, diaphoresis, mood swings, change in appetite, change in weight, increased thirst Hematologic/Lymphatic:  No purpura, petechiae. Allergic/Immunologic: no itchy/runny eyes, nasal congestion, recent allergic reactions, rashes  PHYSICAL EXAM: Vitals:   02/22/18 1254  BP: 90/60  Pulse: (!) 107  Resp: 16  SpO2: 98%    BP  HR Supine 102/68  91 Sitting  96/70  91 Standing 82/60  92 General: No acute distress.  Patient appears well-groomed.  Head:  Normocephalic/atraumatic Eyes:  fundi examined but not visualized Neck: supple, no paraspinal tenderness, full range of motion Back: No paraspinal tenderness Heart: regular rate and rhythm Lungs: Clear to auscultation bilaterally. Vascular: No carotid bruits. Neurological Exam: Mental status: alert and oriented to  person, place, and time, recent and remote memory intact, fund of knowledge intact, attention and concentration intact, speech fluent and not dysarthric, language intact. Cranial nerves: CN I: not tested CN II: pupils equal, round and reactive to light, visual fields intact CN III, IV, VI:  full range of motion, no nystagmus, no ptosis CN V: facial sensation intact CN VII: upper and lower face symmetric CN VIII: hearing intact CN IX, X: gag intact, uvula midline CN XI: sternocleidomastoid and trapezius muscles intact CN XII: tongue midline Bulk & Tone: normal, no fasciculations. Motor:  5/5 throughout  Sensation: temperature and vibration sensation intact. Deep Tendon Reflexes:  2+ throughout, toes downgoing.  Finger to nose testing:  Without dysmetria.  Heel to shin:  Without dysmetria.  Gait:  Normal station and stride.  Able to turn and tandem walk. Romberg negative.  IMPRESSION: 1.  Dizziness.  On our exam today, she does meet criteria for orthostatic hypotension.  Etiology unclear.  It was discussed with cardiology about possibly ordering a tilt table test and I think that may be a good idea.  I  do not suspect an intracranial abnormality.  Dysautonomia was proposed by cardiology.  This can first be evaluated by a tilt table test.  However, I believe the closest specialists are at the [facility] in [place]. 2. Menstrual migraines.  PLAN:   1.  For migraines, consider using a progesterone-based birth control or continuous hormone therapy.  Otherwise, I would recommend a daily preventative such as an antidepressant or topiramate. 2.  Consider magnesium citrate 400mg  daily, riboflavin (B2) 400mg  daily and coenzyme Q 10 100mg  three times daily 3.  Stay hydrated 4.  Do not skip meals 5.  Keep headache diary 6.  Limit pain relievers to no more than 2 days out of the week 7.  I would consider getting a tilt table test   60 minutes spent face to face with patient, over 50% spent  discussing differential diagnoses and management.  Thank you for allowing me to take part in the care of this patient.  Shon Millet, DO  CC:  Nita Sells, MD  Belva Agee, MD

## 2018-04-24 MED FILL — DROSPIRENONE-EE 3-0.03 MG T: 3-0.03 | 84 days supply | Qty: 84 | Fill #1

## 2018-04-28 DIAGNOSIS — J019 Acute sinusitis, unspecified: Secondary | ICD-10-CM | POA: Diagnosis not present

## 2018-10-09 DIAGNOSIS — N911 Secondary amenorrhea: Secondary | ICD-10-CM | POA: Diagnosis not present

## 2018-10-18 DIAGNOSIS — Z3685 Encounter for antenatal screening for Streptococcus B: Secondary | ICD-10-CM | POA: Diagnosis not present

## 2018-10-18 DIAGNOSIS — Z3481 Encounter for supervision of other normal pregnancy, first trimester: Secondary | ICD-10-CM | POA: Diagnosis not present

## 2018-10-18 LAB — OB RESULTS CONSOLE RPR: RPR: NONREACTIVE

## 2018-10-18 LAB — OB RESULTS CONSOLE HIV ANTIBODY (ROUTINE TESTING): HIV: NONREACTIVE

## 2018-10-18 LAB — OB RESULTS CONSOLE HEPATITIS B SURFACE ANTIGEN: Hepatitis B Surface Ag: NEGATIVE

## 2018-10-18 LAB — OB RESULTS CONSOLE RUBELLA ANTIBODY, IGM: Rubella: IMMUNE

## 2018-10-27 DIAGNOSIS — Z3491 Encounter for supervision of normal pregnancy, unspecified, first trimester: Secondary | ICD-10-CM | POA: Diagnosis not present

## 2018-10-27 DIAGNOSIS — M545 Low back pain: Secondary | ICD-10-CM | POA: Diagnosis not present

## 2018-10-27 DIAGNOSIS — N390 Urinary tract infection, site not specified: Secondary | ICD-10-CM | POA: Diagnosis not present

## 2018-10-31 DIAGNOSIS — Z113 Encounter for screening for infections with a predominantly sexual mode of transmission: Secondary | ICD-10-CM | POA: Diagnosis not present

## 2018-10-31 DIAGNOSIS — Z3400 Encounter for supervision of normal first pregnancy, unspecified trimester: Secondary | ICD-10-CM | POA: Diagnosis not present

## 2018-11-15 DIAGNOSIS — Z3491 Encounter for supervision of normal pregnancy, unspecified, first trimester: Secondary | ICD-10-CM | POA: Diagnosis not present

## 2018-11-15 DIAGNOSIS — Z3A12 12 weeks gestation of pregnancy: Secondary | ICD-10-CM | POA: Diagnosis not present

## 2018-11-15 DIAGNOSIS — Z3682 Encounter for antenatal screening for nuchal translucency: Secondary | ICD-10-CM | POA: Diagnosis not present

## 2018-12-12 ENCOUNTER — Encounter (HOSPITAL_COMMUNITY): Payer: Self-pay | Admitting: *Deleted

## 2018-12-12 ENCOUNTER — Other Ambulatory Visit: Payer: Self-pay

## 2018-12-12 ENCOUNTER — Inpatient Hospital Stay (HOSPITAL_COMMUNITY)
Admission: AD | Admit: 2018-12-12 | Discharge: 2018-12-12 | Disposition: A | Discharge status: Home / Self Care | Payer: 59 | Source: Ambulatory Visit | Attending: Obstetrics and Gynecology | Admitting: Obstetrics and Gynecology

## 2018-12-12 DIAGNOSIS — Z3A16 16 weeks gestation of pregnancy: Secondary | ICD-10-CM | POA: Insufficient documentation

## 2018-12-12 DIAGNOSIS — O219 Vomiting of pregnancy, unspecified: Secondary | ICD-10-CM | POA: Diagnosis not present

## 2018-12-12 DIAGNOSIS — O26892 Other specified pregnancy related conditions, second trimester: Secondary | ICD-10-CM | POA: Diagnosis not present

## 2018-12-12 DIAGNOSIS — O210 Mild hyperemesis gravidarum: Secondary | ICD-10-CM | POA: Insufficient documentation

## 2018-12-12 LAB — URINALYSIS, ROUTINE W REFLEX MICROSCOPIC
Bilirubin Urine: NEGATIVE
GLUCOSE, UA: NEGATIVE mg/dL
Ketones, ur: 80 mg/dL — AB
Leukocytes, UA: NEGATIVE
NITRITE: NEGATIVE
PH: 5 (ref 5.0–8.0)
PROTEIN: NEGATIVE mg/dL
Specific Gravity, Urine: 1.026 (ref 1.005–1.030)

## 2018-12-12 LAB — CBC
HEMATOCRIT: 38.8 % (ref 36.0–46.0)
Hemoglobin: 12.9 g/dL (ref 12.0–15.0)
MCH: 31.4 pg (ref 26.0–34.0)
MCHC: 33.2 g/dL (ref 30.0–36.0)
MCV: 94.4 fL (ref 80.0–100.0)
Platelets: 257 10*3/uL (ref 150–400)
RBC: 4.11 MIL/uL (ref 3.87–5.11)
RDW: 13.3 % (ref 11.5–15.5)
WBC: 11.6 10*3/uL — ABNORMAL HIGH (ref 4.0–10.5)
nRBC: 0 % (ref 0.0–0.2)

## 2018-12-12 LAB — COMPREHENSIVE METABOLIC PANEL
ALT: 12 U/L (ref 0–44)
AST: 16 U/L (ref 15–41)
Albumin: 3.5 g/dL (ref 3.5–5.0)
Alkaline Phosphatase: 44 U/L (ref 38–126)
Anion gap: 10 (ref 5–15)
BILIRUBIN TOTAL: 1.6 mg/dL — AB (ref 0.3–1.2)
BUN: 8 mg/dL (ref 6–20)
CO2: 22 mmol/L (ref 22–32)
CREATININE: 0.45 mg/dL (ref 0.44–1.00)
Calcium: 8.2 mg/dL — ABNORMAL LOW (ref 8.9–10.3)
Chloride: 102 mmol/L (ref 98–111)
Glucose, Bld: 82 mg/dL (ref 70–99)
Potassium: 3.4 mmol/L — ABNORMAL LOW (ref 3.5–5.1)
Sodium: 134 mmol/L — ABNORMAL LOW (ref 135–145)
TOTAL PROTEIN: 7.3 g/dL (ref 6.5–8.1)

## 2018-12-12 MED ORDER — ONDANSETRON 8 MG PO TBDP: 8.0000 mg | ORAL_TABLET | Freq: Three times a day (TID) | ORAL | 0 refills | Status: DC | PRN

## 2018-12-12 MED ORDER — LACTATED RINGERS IV BOLUS
1000.0000 mL | Freq: Once | INTRAVENOUS | Status: AC
  Administered 2018-12-12: 1000 mL via INTRAVENOUS

## 2018-12-12 MED ORDER — SODIUM CHLORIDE 0.9 % IV SOLN
8.0000 mg | Freq: Once | INTRAVENOUS | Status: AC
  Administered 2018-12-12: 8 mg via INTRAVENOUS
  Filled 2018-12-12: qty 4

## 2018-12-12 MED ORDER — DEXTROSE 5 % IN LACTATED RINGERS IV BOLUS
1000.0000 mL | Freq: Once | INTRAVENOUS | Status: AC
  Administered 2018-12-12: 1000 mL via INTRAVENOUS

## 2018-12-12 MED ORDER — FAMOTIDINE IN NACL 20-0.9 MG/50ML-% IV SOLN
20.0000 mg | Freq: Once | INTRAVENOUS | Status: AC
  Administered 2018-12-12: 20 mg via INTRAVENOUS
  Filled 2018-12-12: qty 50

## 2018-12-12 NOTE — MAU Provider Note (Addendum)
History     CSN: [id number]  Arrival date and time: 12/12/18 1634   None     Chief Complaint  Patient presents with  . Nausea  . Emesis   HPI   [name] is a 32 y.o. female G2P1001 @ [redacted]w[redacted]d, Woke up today around 0200 with nausea and vomiting. No sick contacts. She vomited 8-10 times since 0200 this morning. Has had mild nausea and vomiting in pregnancy, however nothing like this. She has tried tea, water, and crackers which she through up. No diarrhea, no fever.   OB History    Gravida  2   Para  1   Term  1   Preterm      AB      Living  1     SAB      TAB      Ectopic      Multiple  0   Live Births  1           Past Medical History:  Diagnosis Date  . Complication of anesthesia    N/V; rash  . Dizziness   . Endometriosis   . Heart palpitations     Past Surgical History:  Procedure Laterality Date  . APPENDECTOMY     dr Lovell Sheehan 1999  . CHOLECYSTECTOMY  06/05/2012   Procedure: LAPAROSCOPIC CHOLECYSTECTOMY;  Surgeon: Marlane Hatcher, MD;  Location: [facility];  Service: General;  Laterality: N/A;  . PELVIC LAPAROSCOPY  2017   Endometriosis    Family History  Problem Relation Age of Onset  . Hypertension Father   . Diabetes Maternal Grandmother   . Heart disease Maternal Grandmother   . Hypertension Paternal Grandfather   . Breast cancer Paternal Grandmother 20    Social History   Tobacco Use  . Smoking status: Never Smoker  . Smokeless tobacco: Never Used  Substance Use Topics  . Alcohol use: No    Alcohol/week: 0.0 standard drinks  . Drug use: No    Allergies:  Allergies  Allergen Reactions  . Ceclor [Cefaclor] Hives    Childhood allergy  . Promethazine Hcl Other (See Comments)    hyperactivity  . Latex Rash    Rash when using latex gloves    Medications Prior to Admission  Medication Sig Dispense Refill Last Dose  . drospirenone-ethinyl estradiol (YASMIN,ZARAH,SYEDA) 3-0.03 MG tablet See admin instructions.   2 Not Taking  . Multiple Vitamins-Minerals (ADULT ONE DAILY GUMMIES) CHEW Chew 2 tablets by mouth daily.   Taking   Results for orders placed or performed during the hospital encounter of 12/12/18 (from the past 48 hour(s))  CBC     Status: Abnormal   Collection Time: 12/12/18  5:22 PM  Result Value Ref Range   WBC 11.6 (H) 4.0 - 10.5 K/uL   RBC 4.11 3.87 - 5.11 MIL/uL   Hemoglobin 12.9 12.0 - 15.0 g/dL   HCT 78.2 95.6 - 21.3 %   MCV 94.4 80.0 - 100.0 fL   MCH 31.4 26.0 - 34.0 pg   MCHC 33.2 30.0 - 36.0 g/dL   RDW 08.6 57.8 - 46.9 %   Platelets 257 150 - 400 K/uL   nRBC 0.0 0.0 - 0.2 %    Comment: Performed at [facility], [address], [place], [place] [address]  Comprehensive metabolic panel     Status: Abnormal   Collection Time: 12/12/18  5:22 PM  Result Value Ref Range   Sodium 134 (L) 135 - 145 mmol/L  Potassium 3.4 (L) 3.5 - 5.1 mmol/L   Chloride 102 98 - 111 mmol/L   CO2 22 22 - 32 mmol/L   Glucose, Bld 82 70 - 99 mg/dL   BUN 8 6 - 20 mg/dL   Creatinine, Ser 7.820.45 0.44 - 1.00 mg/dL   Calcium 8.2 (L) 8.9 - 10.3 mg/dL   Total Protein 7.3 6.5 - 8.1 g/dL   Albumin 3.5 3.5 - 5.0 g/dL   AST 16 15 - 41 U/L   ALT 12 0 - 44 U/L   Alkaline Phosphatase 44 38 - 126 U/L   Total Bilirubin 1.6 (H) 0.3 - 1.2 mg/dL   GFR calc non Af Amer >60 >60 mL/min   GFR calc Af Amer >60 >60 mL/min   Anion gap 10 5 - 15    Comment: Performed at [facility], [address] [place], [place] [address]  Urinalysis, Routine w reflex microscopic     Status: Abnormal   Collection Time: 12/12/18  6:02 PM  Result Value Ref Range   Color, Urine YELLOW YELLOW   APPearance CLEAR CLEAR   Specific Gravity, Urine 1.026 1.005 - 1.030   pH 5.0 5.0 - 8.0   Glucose, UA NEGATIVE NEGATIVE mg/dL   Hgb urine dipstick SMALL (A) NEGATIVE   Bilirubin Urine NEGATIVE NEGATIVE   Ketones, ur 80 (A) NEGATIVE mg/dL   Protein, ur NEGATIVE NEGATIVE mg/dL   Nitrite NEGATIVE NEGATIVE   Leukocytes, UA NEGATIVE  NEGATIVE   RBC / HPF 0-5 0 - 5 RBC/hpf   WBC, UA 0-5 0 - 5 WBC/hpf   Bacteria, UA RARE (A) NONE SEEN   Squamous Epithelial / LPF 0-5 0 - 5   Mucus PRESENT     Comment: Performed at [facility], [address] [place], [place] [address]   Review of Systems  Gastrointestinal: Positive for nausea and vomiting. Negative for diarrhea.   Physical Exam   Blood pressure (!) 110/53, pulse 99, temperature 98.8 F (37.1 C), resp. rate 16, height 5\' 7"  (1.702 m), weight 47.6 kg, last menstrual period 08/21/2018, not currently breastfeeding.  Physical Exam  Constitutional: She is oriented to person, place, and time. She appears well-developed and well-nourished. No distress.  GI: Soft. She exhibits no distension and no mass. There is no abdominal tenderness. There is no rebound and no guarding.  Neurological: She is alert and oriented to person, place, and time.  Skin: Skin is warm. She is not diaphoretic.  Psychiatric: Her behavior is normal.   MAU Course  Procedures  None  MDM  + fetal heart tones via doppler  Lr bolus x1 D5Lr bolus x1 CMP Pepcid 20 mg IV Zofran 8 mg IV Po challenge:   [place] Mistie Adney to resume care: patient tolerating oral fluids at this time  Venia Carbonasch, Jennifer I, NP 12/12/2018 8:10 PM  No further N/V.   Assessment 1. Nausea and vomiting in pregnancy   2. [redacted] weeks gestation of pregnancy    PLAN: D/C home in stable condition Advance diet slowly.  Follow-up Information    [facility] Follow up.   Specialty:  Obstetrics and Gynecology Why:  as needed, if symptoms worsen  Contact information: [address] [id number] mc [place] [id number] [phone]         Allergies as of 12/12/2018      Reactions   Ceclor [cefaclor] Hives   Childhood allergy   Promethazine Hcl Other (See Comments)   hyperactivity   Latex Rash  Rash when using latex gloves      Medication List    TAKE these medications    ADULT ONE DAILY GUMMIES Chew Chew 2 tablets by mouth daily.   drospirenone-ethinyl estradiol 3-0.03 MG tablet Commonly known as:  YASMIN,ZARAH,SYEDA See admin instructions.   ondansetron 8 MG disintegrating tablet Commonly known as:  ZOFRAN ODT Take 1 tablet (8 mg total) by mouth every 8 (eight) hours as needed for nausea or vomiting.      Katrinka BlazingSmith, [place], [place] 12/13/2018 6:36 AM   Assessment and Plan

## 2018-12-12 NOTE — Discharge Instructions (Signed)
Morning Sickness    Morning sickness is when you feel sick to your stomach (nauseous) during pregnancy. You may feel sick to your stomach and throw up (vomit). You may feel sick in the morning, but you can feel this way at any time of day. Some women feel very sick to their stomach and cannot stop throwing up (hyperemesis gravidarum).  Follow these instructions at home:  Medicines   Take over-the-counter and prescription medicines only as told by your doctor. Do not take any medicines until you talk with your doctor about them first.   Taking multivitamins before getting pregnant can stop or lessen the harshness of morning sickness.  Eating and drinking   Eat dry toast or crackers before getting out of bed.   Eat 5 or 6 small meals a day.   Eat dry and bland foods like rice and baked potatoes.   Do not eat greasy, fatty, or spicy foods.   Have someone cook for you if the smell of food causes you to feel sick or throw up.   If you feel sick to your stomach after taking prenatal vitamins, take them at night or with a snack.   Eat protein when you need a snack. Nuts, yogurt, and cheese are good choices.   Drink fluids throughout the day.   Try ginger ale made with real ginger, ginger tea made from fresh grated ginger, or ginger candies.  General instructions   Do not use any products that have nicotine or tobacco in them, such as cigarettes and e-cigarettes. If you need help quitting, ask your doctor.   Use an air purifier to keep the air in your house free of smells.   Get lots of fresh air.   Try to avoid smells that make you feel sick.   Try:  ? Wearing a bracelet that is used for seasickness (acupressure wristband).  ? Going to a doctor who puts thin needles into certain body points (acupuncture) to improve how you feel.  Contact a doctor if:   You need medicine to feel better.   You feel dizzy or light-headed.   You are losing weight.  Get help right away if:   You feel very sick to your  stomach and cannot stop throwing up.   You pass out (faint).   You have very bad pain in your belly.  Summary   Morning sickness is when you feel sick to your stomach (nauseous) during pregnancy.   You may feel sick in the morning, but you can feel this way at any time of day.   Making some changes to what you eat may help your symptoms go away.  This information is not intended to replace advice given to you by your health care provider. Make sure you discuss any questions you have with your health care provider.  Document Released: 01/06/2005 Document Revised: 12/30/2016 Document Reviewed: 12/30/2016  Elsevier Interactive Patient Education  2019 Elsevier Inc.

## 2018-12-13 NOTE — L&D Delivery Note (Signed)
Delivery Note At 11:09 AM a viable female was delivered via  NSVD ROA Position   APGAR: 9 9    Placenta status: spontaneously with 3 vessel cord , .  Cord:  with the following complications: none.  Cord pH: not obtained  Anesthesia:  epidural Episiotomy:  none Lacerations: none   Suture Repair: 3.0 chromic Est. Blood Loss (mL):  300  Mom to postpartum.  Baby to Couplet care / Skin to Skin.  Jeani Hawking 05/16/2019, 11:21 AM

## 2018-12-27 DIAGNOSIS — Z363 Encounter for antenatal screening for malformations: Secondary | ICD-10-CM | POA: Diagnosis not present

## 2018-12-27 DIAGNOSIS — Z3492 Encounter for supervision of normal pregnancy, unspecified, second trimester: Secondary | ICD-10-CM | POA: Diagnosis not present

## 2018-12-27 DIAGNOSIS — Z3A18 18 weeks gestation of pregnancy: Secondary | ICD-10-CM | POA: Diagnosis not present

## 2019-01-31 NOTE — Progress Notes (Signed)
NEUROLOGY FOLLOW UP OFFICE NOTE  Maria Walton [id number]  HISTORY OF PRESENT ILLNESS: Maria Walton is a 33 year old woman who follows up for left sided paresthesias  She is accompanied by her husband.  UPDATE: Last seen in March 2019 for dizziness and menstrual migraines.  She is currently [redacted] weeks pregnant.  2 weeks ago she developed sudden onset of left sided paresthesias of face, arm and leg.  Sometimes it will just involve the face, arm or leg.  She has sensation of pulling on left side of face but no facial droop or pain and weakness of extremities.  No visual changes.  It is intermittent, sometimes lasting an hour, occurring daily.  One time, when the entire left side was numb, lasting all day.  Afterward she had a left-sided headache, different than prior migraine headache.  However, she has not had an associated headache with the other episodes.  She has had her typical migraines during this pregnancy as well. While she has had tingling on the left side of her scalp with her migraines, she has never previously had left sided numbness and tingling of the face, arm or leg. Blood pressure has been okay.  HISTORY: She began experiencing dizziness around 2016.  She always feels lightheaded with sensation that she is going to pass out.  She has never passed out, however.  Her blood pressure typically runs around 90/60.  The dizziness improved during her pregnancy last year when her blood pressure was around 110-120/60s.  She is underweight with a BMI of 16.67.  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  She also has history of palpitations.  She underwent cardiac workup including cardiac and blood pressure monitoring, which was normal and was told to increase fluids and salt intake.  She is not anemic.  Blood sugar has been normal.  On occasion, she has episodes of different dizziness, specifically vertigo described as  spinning sensation.  It may occur for a few hours and sometimes is a precursor for a migraine later.  She was found to be orthostatic on exam in March 2019 and Tilt table test was recommended.  She also has menstrual migraines.  She began having migraines in her early 7020s.  They became worse following the birth of her son last summer.  She reports moderate to severe throbbing retro-orbital pain or pain on top of her head, either side. It is often preceded by black spots in the vision of her left eye which continues with the headache.  She has associated nausea, photophobia and difficulty concentrating.  Sometimes she has vomiting.  They last 2 hour to several hours.  They usually occur 1-2 days prior to onset of menses and during her menses.  Sometimes they occur right after menses.  She experiences a postdrome sensation of being drunk for a day.  There are no other triggers and rest helps relieve it.  She takes Tylenol.  She hasn't tried other pain relievers.  She has had changes to her birth control.  PAST MEDICAL HISTORY: Past Medical History:  Diagnosis Date  . Complication of anesthesia    N/V; rash  . Dizziness   . Endometriosis   . Heart palpitations     MEDICATIONS: Current Outpatient Medications on File Prior to Visit  Medication Sig Dispense Refill  . drospirenone-ethinyl estradiol (YASMIN,ZARAH,SYEDA) 3-0.03 MG tablet See admin instructions.  2  . Multiple  Vitamins-Minerals (ADULT ONE DAILY GUMMIES) CHEW Chew 2 tablets by mouth daily.    . ondansetron (ZOFRAN ODT) 8 MG disintegrating tablet Take 1 tablet (8 mg total) by mouth every 8 (eight) hours as needed for nausea or vomiting. 20 tablet 0   No current facility-administered medications on file prior to visit.     ALLERGIES: Allergies  Allergen Reactions  . Ceclor [Cefaclor] Hives    Childhood allergy  . Promethazine Hcl Other (See Comments)    hyperactivity  . Latex Rash    Rash when using latex gloves    FAMILY  HISTORY: Family History  Problem Relation Age of Onset  . Hypertension Father   . Diabetes Maternal Grandmother   . Heart disease Maternal Grandmother   . Hypertension Paternal Grandfather   . Breast cancer Paternal Grandmother 88    SOCIAL HISTORY: Social History   Socioeconomic History  . Marital status: Married    Spouse name: Not on file  . Number of children: Not on file  . Years of education: Not on file  . Highest education level: Not on file  Occupational History  . Occupation: Charity fundraiser   Social Needs  . Financial resource strain: Not on file  . Food insecurity:    Worry: Not on file    Inability: Not on file  . Transportation needs:    Medical: Not on file    Non-medical: Not on file  Tobacco Use  . Smoking status: Never Smoker  . Smokeless tobacco: Never Used  Substance and Sexual Activity  . Alcohol use: No    Alcohol/week: 0.0 standard drinks  . Drug use: No  . Sexual activity: Yes    Birth control/protection: None    Comment: intercourse age 31, sexual partners less than 5  Lifestyle  . Physical activity:    Days per week: Not on file    Minutes per session: Not on file  . Stress: Not on file  Relationships  . Social connections:    Talks on phone: Not on file    Gets together: Not on file    Attends religious service: Not on file    Active member of club or organization: Not on file    Attends meetings of clubs or organizations: Not on file    Relationship status: Not on file  . Intimate partner violence:    Fear of current or ex partner: Not on file    Emotionally abused: Not on file    Physically abused: Not on file    Forced sexual activity: Not on file  Other Topics Concern  . Not on file  Social History Narrative  . Not on file    REVIEW OF SYSTEMS: Constitutional: No fevers, chills, or sweats, no generalized fatigue, change in appetite Eyes: No visual changes, double vision, eye pain Ear, nose and throat: No hearing loss, ear pain, nasal  congestion, sore throat Cardiovascular: No chest pain, palpitations Respiratory:  No shortness of breath at rest or with exertion, wheezes GastrointestinaI: No nausea, vomiting, diarrhea, abdominal pain, fecal incontinence Genitourinary:  No dysuria, urinary retention or frequency Musculoskeletal:  No neck pain, back pain Integumentary: No rash, pruritus, skin lesions Neurological: as above Psychiatric: No depression, insomnia, anxiety Endocrine: No palpitations, fatigue, diaphoresis, mood swings, change in appetite, change in weight, increased thirst Hematologic/Lymphatic:  No purpura, petechiae. Allergic/Immunologic: no itchy/runny eyes, nasal congestion, recent allergic reactions, rashes  PHYSICAL EXAM: Blood pressure 106/64, pulse 95, height 5' 7.75" (1.721 m), weight 116 lb (  52.6 kg), last menstrual period 08/21/2018, SpO2 98 %, not currently breastfeeding. General: No acute distress.  Patient appears well-groomed.   Head:  Normocephalic/atraumatic Eyes:  Fundi examined but not visualized Neck: supple, no paraspinal tenderness, full range of motion Heart:  Regular rate and rhythm Lungs:  Clear to auscultation bilaterally Back: No paraspinal tenderness Neurological Exam: alert and oriented to person, place, and time. Attention span and concentration intact, recent and remote memory intact, fund of knowledge intact.  Speech fluent and not dysarthric, language intact.  CN II-XII intact. Bulk and tone normal, muscle strength 5/5 throughout.  Sensation to pinprick and vibration intact.  Deep tendon reflexes 2+ throughout, toes downgoing.  Finger to nose and heel to shin testing intact.  Gait normal, Romberg negative.  IMPRESSION: Episodic left sided paresthesias.  Likely migraine-related as it is involving same side as her typical migraines and typical migraines have been associated with paresthesias on the left side of her scalp.  She does not have severe/new headaches or objective  lateralizing findings on neurologic exam to indicate an intracranial abnormality such as stroke/venous thrombosis.  I think MRI of brain is warranted but given the lack of severe headache and no objective findings, I would defer MRI until after pregnancy.  She will follow up after she delivers her baby (June).  We will check B12.  If she should have new or worsening symptoms, she will contact me.  25 minutes spent face to face with patient, over 50% spent discussing diagnosis and management.  Shon Millet, DO  CC: Maria Sells, MD

## 2019-02-01 ENCOUNTER — Encounter: Payer: Self-pay | Admitting: Neurology

## 2019-02-01 ENCOUNTER — Ambulatory Visit: Payer: 59 | Admitting: Neurology

## 2019-02-01 ENCOUNTER — Other Ambulatory Visit (INDEPENDENT_AMBULATORY_CARE_PROVIDER_SITE_OTHER): Payer: 59

## 2019-02-01 VITALS — BP 106/64 | HR 95 | Ht 67.75 in | Wt 116.0 lb

## 2019-02-01 DIAGNOSIS — R200 Anesthesia of skin: Secondary | ICD-10-CM

## 2019-02-01 DIAGNOSIS — R6889 Other general symptoms and signs: Secondary | ICD-10-CM | POA: Diagnosis not present

## 2019-02-01 DIAGNOSIS — G43829 Menstrual migraine, not intractable, without status migrainosus: Secondary | ICD-10-CM | POA: Diagnosis not present

## 2019-02-01 LAB — VITAMIN B12: VITAMIN B 12: 263 pg/mL (ref 211–911)

## 2019-02-01 NOTE — Patient Instructions (Addendum)
Probably it is migraine related. Objectively, your neurologic exam is normal.  You are not having new severe headaches.  If things don't change, I would wait on any imaging (such as MRI) until after you have the baby.  If you have new or worrisome symptoms (severe headache, weakness, etc), then contact me.  Follow up after you have your baby. We will check b12  Your provider has requested that you have labwork completed today. Please go to [facility] ([id number]) on the second floor of this building before leaving the office today. You do not need to check in. If you are not called within 15 minutes please check with the front desk.

## 2019-02-02 ENCOUNTER — Telehealth: Payer: Self-pay

## 2019-02-02 NOTE — Telephone Encounter (Signed)
Called and spoke with Pt, advised her of B12 results, and to check with OB/GYN and if no issues, to start OTC QD

## 2019-02-02 NOTE — Telephone Encounter (Signed)
-----   Message from Drema Dallas, DO sent at 02/01/2019  7:45 PM EST ----- B12 level is in the low normal range which may cause symptoms of deficiency.  If there is no contraindication, then she can take a pill daily

## 2019-02-05 ENCOUNTER — Telehealth: Payer: Self-pay | Admitting: Neurology

## 2019-02-05 ENCOUNTER — Telehealth: Payer: Self-pay

## 2019-02-05 NOTE — Telephone Encounter (Signed)
Called and advised Pt of results, routed labs to her OB Dr. Belva Agee.

## 2019-02-05 NOTE — Telephone Encounter (Signed)
Opened in error

## 2019-02-05 NOTE — Telephone Encounter (Signed)
Patient needs to know the actual level for the B-12 for her OB DR. Please call

## 2019-02-23 DIAGNOSIS — Z23 Encounter for immunization: Secondary | ICD-10-CM | POA: Diagnosis not present

## 2019-02-23 DIAGNOSIS — Z3400 Encounter for supervision of normal first pregnancy, unspecified trimester: Secondary | ICD-10-CM | POA: Diagnosis not present

## 2019-02-23 DIAGNOSIS — Z369 Encounter for antenatal screening, unspecified: Secondary | ICD-10-CM | POA: Diagnosis not present

## 2019-03-28 DIAGNOSIS — O26843 Uterine size-date discrepancy, third trimester: Secondary | ICD-10-CM | POA: Diagnosis not present

## 2019-03-28 DIAGNOSIS — Z3A31 31 weeks gestation of pregnancy: Secondary | ICD-10-CM | POA: Diagnosis not present

## 2019-03-28 DIAGNOSIS — Z3493 Encounter for supervision of normal pregnancy, unspecified, third trimester: Secondary | ICD-10-CM | POA: Diagnosis not present

## 2019-04-11 ENCOUNTER — Encounter

## 2019-04-11 ENCOUNTER — Ambulatory Visit: Payer: 59 | Admitting: Neurology

## 2019-04-11 DIAGNOSIS — Z3493 Encounter for supervision of normal pregnancy, unspecified, third trimester: Secondary | ICD-10-CM | POA: Diagnosis not present

## 2019-04-11 DIAGNOSIS — D649 Anemia, unspecified: Secondary | ICD-10-CM | POA: Diagnosis not present

## 2019-04-17 ENCOUNTER — Other Ambulatory Visit: Payer: Self-pay

## 2019-04-17 ENCOUNTER — Inpatient Hospital Stay (HOSPITAL_COMMUNITY): Payer: 59

## 2019-04-17 ENCOUNTER — Encounter (HOSPITAL_COMMUNITY): Payer: Self-pay | Admitting: *Deleted

## 2019-04-17 ENCOUNTER — Inpatient Hospital Stay (HOSPITAL_COMMUNITY)
Admission: AD | Admit: 2019-04-17 | Discharge: 2019-04-19 | DRG: 833 | Disposition: A | Discharge status: Home / Self Care | Payer: 59 | Attending: Obstetrics and Gynecology | Admitting: Obstetrics and Gynecology

## 2019-04-17 DIAGNOSIS — Z3A34 34 weeks gestation of pregnancy: Secondary | ICD-10-CM

## 2019-04-17 DIAGNOSIS — O4703 False labor before 37 completed weeks of gestation, third trimester: Secondary | ICD-10-CM

## 2019-04-17 DIAGNOSIS — Z363 Encounter for antenatal screening for malformations: Secondary | ICD-10-CM

## 2019-04-17 HISTORY — DX: Headache: R51

## 2019-04-17 HISTORY — DX: Headache, unspecified: R51.9

## 2019-04-17 HISTORY — DX: Anemia, unspecified: D64.9

## 2019-04-17 LAB — TYPE AND SCREEN
ABO/RH(D): O POS
Antibody Screen: NEGATIVE

## 2019-04-17 LAB — CBC
HCT: 39.2 % (ref 36.0–46.0)
Hemoglobin: 12.9 g/dL (ref 12.0–15.0)
MCH: 30.1 pg (ref 26.0–34.0)
MCHC: 32.9 g/dL (ref 30.0–36.0)
MCV: 91.6 fL (ref 80.0–100.0)
Platelets: 242 10*3/uL (ref 150–400)
RBC: 4.28 MIL/uL (ref 3.87–5.11)
RDW: 14.2 % (ref 11.5–15.5)
WBC: 11.9 10*3/uL — ABNORMAL HIGH (ref 4.0–10.5)
nRBC: 0 % (ref 0.0–0.2)

## 2019-04-17 LAB — ABO/RH: ABO/RH(D): O POS

## 2019-04-17 LAB — URINALYSIS, ROUTINE W REFLEX MICROSCOPIC
Bilirubin Urine: NEGATIVE
Glucose, UA: NEGATIVE mg/dL
Hgb urine dipstick: NEGATIVE
Ketones, ur: NEGATIVE mg/dL
Leukocytes,Ua: NEGATIVE
Nitrite: NEGATIVE
Protein, ur: NEGATIVE mg/dL
Specific Gravity, Urine: 1.008 (ref 1.005–1.030)
pH: 7 (ref 5.0–8.0)

## 2019-04-17 LAB — RPR: RPR Ser Ql: NONREACTIVE

## 2019-04-17 MED ORDER — ACETAMINOPHEN 325 MG PO TABS
650.0000 mg | ORAL_TABLET | ORAL | Status: DC | PRN
  Administered 2019-04-17 – 2019-04-18 (×3): 650 mg via ORAL
  Filled 2019-04-17 (×3): qty 2

## 2019-04-17 MED ORDER — DOCUSATE SODIUM 100 MG PO CAPS
100.0000 mg | ORAL_CAPSULE | Freq: Every day | ORAL | Status: DC
  Filled 2019-04-17 (×2): qty 1

## 2019-04-17 MED ORDER — ZOLPIDEM TARTRATE 5 MG PO TABS: 5.0000 mg | ORAL_TABLET | Freq: Every evening | ORAL | Status: DC | PRN

## 2019-04-17 MED ORDER — MAGNESIUM SULFATE BOLUS VIA INFUSION
4.0000 g | Freq: Once | INTRAVENOUS | Status: AC
  Administered 2019-04-17: 4 g via INTRAVENOUS
  Filled 2019-04-17: qty 500

## 2019-04-17 MED ORDER — PRENATAL MULTIVITAMIN CH
1.0000 | ORAL_TABLET | Freq: Every day | ORAL | Status: DC
  Filled 2019-04-17 (×2): qty 1

## 2019-04-17 MED ORDER — LACTATED RINGERS IV SOLN
INTRAVENOUS | Status: DC
  Administered 2019-04-17 – 2019-04-18 (×4): via INTRAVENOUS
  Administered 2019-04-18: 100 mL/h via INTRAVENOUS
  Administered 2019-04-18: 13:00:00 via INTRAVENOUS

## 2019-04-17 MED ORDER — CYCLOBENZAPRINE HCL 10 MG PO TABS
10.0000 mg | ORAL_TABLET | Freq: Once | ORAL | Status: AC
  Administered 2019-04-17: 10 mg via ORAL
  Filled 2019-04-17: qty 1

## 2019-04-17 MED ORDER — CALCIUM CARBONATE ANTACID 500 MG PO CHEW
2.0000 | CHEWABLE_TABLET | ORAL | Status: DC | PRN
  Administered 2019-04-17 – 2019-04-18 (×3): 400 mg via ORAL
  Filled 2019-04-17 (×3): qty 2

## 2019-04-17 MED ORDER — BETAMETHASONE SOD PHOS & ACET 6 (3-3) MG/ML IJ SUSP
12.0000 mg | INTRAMUSCULAR | Status: DC
  Filled 2019-04-17 (×2): qty 2

## 2019-04-17 MED ORDER — BETAMETHASONE SOD PHOS & ACET 6 (3-3) MG/ML IJ SUSP
12.0000 mg | INTRAMUSCULAR | Status: AC
  Administered 2019-04-17 – 2019-04-18 (×2): 12 mg via INTRAMUSCULAR
  Filled 2019-04-17 (×3): qty 2

## 2019-04-17 MED ORDER — NIFEDIPINE 10 MG PO CAPS
10.0000 mg | ORAL_CAPSULE | ORAL | Status: AC | PRN
  Administered 2019-04-17 – 2019-04-19 (×4): 10 mg via ORAL
  Filled 2019-04-17 (×5): qty 1

## 2019-04-17 MED ORDER — LACTATED RINGERS IV BOLUS
1000.0000 mL | Freq: Once | INTRAVENOUS | Status: AC
  Administered 2019-04-17: 1000 mL via INTRAVENOUS

## 2019-04-17 MED ORDER — MAGNESIUM SULFATE 40 G IN LACTATED RINGERS - SIMPLE
1.0000 g/h | INTRAVENOUS | Status: AC
  Administered 2019-04-18: 2 g/h via INTRAVENOUS
  Filled 2019-04-17 (×2): qty 500

## 2019-04-17 NOTE — H&P (Signed)
Maria Walton is a 33 y.o. female presenting for preterm labor.  Pt evaluated in MAU and given 2 doses of procardia.  She continued to contract Q2-79min and was admitted to L&D.  She is currently getting mag bolus and has noticed her ctx are less painful.  No vb or lof.   OB History    Gravida  2   Para  1   Term  1   Preterm      AB      Living  1     SAB      TAB      Ectopic      Multiple  0   Live Births  1          Past Medical History:  Diagnosis Date  . Anemia    Only with this pregnancy; taking iron supplements  . Complication of anesthesia    N/V; rash  . Dizziness   . Endometriosis   . Headache   . Heart palpitations    Past Surgical History:  Procedure Laterality Date  . APPENDECTOMY     dr Lovell Sheehan 1999  . CHOLECYSTECTOMY  06/05/2012   Procedure: LAPAROSCOPIC CHOLECYSTECTOMY;  Surgeon: Marlane Hatcher, MD;  Location: [facility];  Service: General;  Laterality: N/A;  . PELVIC LAPAROSCOPY  2017   Endometriosis   Family History: family history includes Breast cancer (age of onset: 37) in her paternal grandmother; Diabetes in her maternal grandmother; Heart disease in her maternal grandmother; Hypertension in her father and paternal grandfather. Social History:  reports that she has never smoked. She has never used smokeless tobacco. She reports that she does not drink alcohol or use drugs.     Maternal Diabetes: No Genetic Screening: Declined Maternal Ultrasounds/Referrals: Normal Fetal Ultrasounds or other Referrals:  None Maternal Substance Abuse:  No Significant Maternal Medications:  None Significant Maternal Lab Results:  None Other Comments:  None  ROS History Dilation: 3.5 Effacement (%): 80 Station: -2 Exam by:: V Duhart CNM Blood pressure 110/63, pulse (!) 102, temperature 98.5 F (36.9 C), temperature source Oral, resp. rate 16, height 5\' 7"  (1.702 m), weight 56.7 kg, last menstrual period 08/21/2018, SpO2 98 %, not currently  breastfeeding. Exam Physical Exam  Gen - NAD Abd - gravid, NT Ext - NT, no edema Cvx 3.5cm, changed from 2.5cm in MAU Prenatal labs: ABO, Rh: --/--/O POS (05/05 3295) Antibody: NEG (05/05 0641) Rubella:   RPR:    HBsAg:    HIV:    GBS:   pending  Assessment/Plan:  Preterm labor Admit Magnesium  BMZ [place] for EFW GBS done, results pending    Zelphia Cairo 04/17/2019, 8:25 AM

## 2019-04-17 NOTE — MAU Note (Signed)
Pt was suddenly awoken with low back pain that traveled around to the low front and comes every 5 min, lasting for 1 min.  She tried position change and drinking water without any relief. Did not take anything for pain. States baby is moving well and denies any vag leaking or bleeding.

## 2019-04-17 NOTE — MAU Provider Note (Signed)
History     CSN: [id number]  Arrival date and time: 04/17/19 [id number]   First Provider Initiated Contact with Patient 04/17/19 0455      Chief Complaint  Patient presents with  . Contractions  . Back Pain   Maria Berlinshley J Delmar is a 33 y.o. G2P1 at 6063w1d who presents to MAU with complaints of contractions and back pain. She reports symptoms started occurring this morning around 0130. Reports history of occasional BH contractions and back pain during this pregnancy but this "feels different than before". Reports pain starts in her back and radiates around to her abdomen, describes the pain as cramping and contractions. She tried drinking water and position change at home without relief of contractions. Rates pain 5/10- has not taken any medication for pain. Reports contractions occur every 5 minutes, spoke to on call nurse who directed her to MAU for evaluation. She denies having her cervix checked in the office during this pregnancy, denies hx of PTL or PTD with last pregnancy. Denies vaginal bleeding or LOF. +FM.    OB History    Gravida  2   Para  1   Term  1   Preterm      AB      Living  1     SAB      TAB      Ectopic      Multiple  0   Live Births  1           Past Medical History:  Diagnosis Date  . Anemia   . Complication of anesthesia    N/V; rash  . Dizziness   . Endometriosis   . Headache   . Heart palpitations     Past Surgical History:  Procedure Laterality Date  . APPENDECTOMY     dr Lovell Sheehanjenkins 1999  . CHOLECYSTECTOMY  06/05/2012   Procedure: LAPAROSCOPIC CHOLECYSTECTOMY;  Surgeon: Marlane HatcherWilliam S Bradford, MD;  Location: [facility];  Service: General;  Laterality: N/A;  . PELVIC LAPAROSCOPY  2017   Endometriosis    Family History  Problem Relation Age of Onset  . Hypertension Father   . Diabetes Maternal Grandmother   . Heart disease Maternal Grandmother   . Hypertension Paternal Grandfather   . Breast cancer Paternal Grandmother [id number]    Social  History   Tobacco Use  . Smoking status: Never Smoker  . Smokeless tobacco: Never Used  Substance Use Topics  . Alcohol use: No    Alcohol/week: 0.0 standard drinks  . Drug use: No    Allergies:  Allergies  Allergen Reactions  . Ceclor [Cefaclor] Hives    Childhood allergy  . Promethazine Hcl Other (See Comments)    hyperactivity  . Latex Rash    Rash when using latex gloves    Medications Prior to Admission  Medication Sig Dispense Refill Last Dose  . Multiple Vitamins-Minerals (ADULT ONE DAILY GUMMIES) CHEW Chew 2 tablets by mouth daily.   Taking    Review of Systems  Constitutional: Negative.   Cardiovascular: Negative.   Gastrointestinal: Positive for abdominal pain. Negative for constipation, diarrhea, nausea and vomiting.  Genitourinary: Negative.   Musculoskeletal: Positive for back pain.  Neurological: Negative.    Physical Exam   Blood pressure 117/66, pulse (!) 105, temperature 98.3 F (36.8 C), temperature source Oral, resp. rate 15, height 5\' 7"  (1.702 m), weight 56.7 kg, last menstrual period 08/21/2018, not currently breastfeeding.  Physical Exam  Nursing note and vitals reviewed. Constitutional: She is  oriented to person, place, and time. She appears well-developed and well-nourished. No distress.  Cardiovascular: Normal rate, regular rhythm and normal heart sounds.  Respiratory: Effort normal and breath sounds normal. No respiratory distress. She has no wheezes. She has no rales.  GI: Soft. There is no abdominal tenderness. There is no rebound and no guarding.  Gravid small for gestational age, moderate contractions palpated    Musculoskeletal: Normal range of motion.        General: No edema.  Neurological: She is alert and oriented to person, place, and time.  Psychiatric: She has a normal mood and affect. Her behavior is normal. Thought content normal.   Cervical examination @ 0500:  Dilation: 2.5 Effacement (%): 70 Cervical Position:  Posterior Station: -2 Presentation: Vertex Exam by:: V Calais CNM   FHR:  145/moderate/ +accels/ no decel  Toco: 3-4 minutes/ moderate by palpation  MAU Course  Procedures  MDM Orders Placed This Encounter  Procedures  . Urinalysis, Routine w reflex microscopic  . Encourage/Reinforce Importance Po Fluids   Meds ordered this encounter  Medications  . NIFEdipine (PROCARDIA) capsule 10 mg  . cyclobenzaprine (FLEXERIL) tablet 10 mg   Patient declines flexeril  2 doses of Procardia given- due to drop in BP unable to give third dose  Cervix checked upon reassessment   Dilation: 3.5 Effacement (%): 80 Cervical Position: Posterior Station: -2 Presentation: Vertex Exam by:: Lanice Shirts CNM  Patient went from tight 2.5cm to loose 3.5cm, posterior Continued regular contractions 3-4 minutes   Consult with Dr Langston Masker @0642 , recommends admission to labor & delivery and BMZ injection.  Orders placed for admission by Dr Langston Masker   Care assumed by Dr Langston Masker   Assessment and Plan  1. Preterm labor   Admit to labor and delivery  BMZ injection given in MAU  Care assumed by Dr Langston Masker   Sharyon Cable, CNM 04/17/19, 7:25 AM

## 2019-04-18 DIAGNOSIS — Z3A34 34 weeks gestation of pregnancy: Secondary | ICD-10-CM | POA: Diagnosis not present

## 2019-04-18 MED ORDER — FAMOTIDINE 20 MG PO TABS
20.0000 mg | ORAL_TABLET | Freq: Every day | ORAL | Status: DC
  Administered 2019-04-18: 20 mg via ORAL
  Filled 2019-04-18: qty 1

## 2019-04-18 MED ORDER — ALUM & MAG HYDROXIDE-SIMETH 200-200-20 MG/5ML PO SUSP
15.0000 mL | Freq: Four times a day (QID) | ORAL | Status: DC | PRN
  Administered 2019-04-18: 15 mL via ORAL
  Filled 2019-04-18: qty 30

## 2019-04-18 MED ORDER — ONDANSETRON HCL 4 MG PO TABS: 4.0000 mg | ORAL_TABLET | Freq: Three times a day (TID) | ORAL | Status: DC | PRN

## 2019-04-18 NOTE — Progress Notes (Signed)
Patient ID: Maria Walton, female   DOB: 1986/08/17, 33 y.o.   MRN: [id number] Pt c/o weakness in legs and HA.  Better since lowering Mag.  VSSAF FHR 140 cat 1 Ctxs uterine irritability  Cx last per RN 3.5cm/80/-2   PTL at 34 2/7 weeks with adv dilation Will wean off of Mag today S/P BMZ series GBS culture pending Discussed plan of care.  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

## 2019-04-19 MED ORDER — NIFEDIPINE 10 MG PO CAPS: 10.0000 mg | ORAL_CAPSULE | ORAL | 0 refills | Status: DC | PRN

## 2019-04-19 NOTE — Progress Notes (Signed)
Discharge instructions given to patient. Discussed medication changes and instructions. Pt verbalized she had an appointment with Dr Elon Spanner set up. Answered pt's questions regarding preterm labor. Pt verbalized understanding of discharge instructions.

## 2019-04-19 NOTE — Discharge Summary (Signed)
Admission Diagnosis: PTL at 34 weeks  Discharge Diagnosis: Same  Hospital Course:  33 year old female admitted with PTL. She received Magnesium, Betamethasone and Procardia after admission.  After 36 hours, her contractions spaced and she was only using Procardia prn.  BP (!) 91/59 (BP Location: Left Arm)   Pulse 93   Temp 98.1 F (36.7 C) (Oral)   Resp 18   Ht 5\' 7"  (1.702 m)   Wt 56.7 kg   LMP 08/21/2018   SpO2 99%   BMI 19.56 kg/m  Scheduled Meds: . docusate sodium  100 mg Oral Daily  . famotidine  20 mg Oral Daily  . prenatal multivitamin  1 tablet Oral Q1200   Continuous Infusions: . lactated ringers 125 mL/hr at 04/18/19 2321   PRN Meds:.acetaminophen, alum & mag hydroxide-simeth, calcium carbonate, ondansetron, zolpidem Abdomen soft and non tender  Uterus is soft  Patient discharged home on bedrest Rx Procardia prn Follow up in 1 week Out of work on bedrest

## 2019-04-21 ENCOUNTER — Other Ambulatory Visit: Payer: Self-pay

## 2019-04-21 ENCOUNTER — Inpatient Hospital Stay (HOSPITAL_COMMUNITY)
Admission: AD | Admit: 2019-04-21 | Discharge: 2019-04-22 | Disposition: A | Discharge status: Home / Self Care | Payer: 59 | Attending: Obstetrics and Gynecology | Admitting: Obstetrics and Gynecology

## 2019-04-21 ENCOUNTER — Encounter (HOSPITAL_COMMUNITY): Payer: Self-pay | Admitting: *Deleted

## 2019-04-21 DIAGNOSIS — Z3A34 34 weeks gestation of pregnancy: Secondary | ICD-10-CM | POA: Diagnosis not present

## 2019-04-21 DIAGNOSIS — O4700 False labor before 37 completed weeks of gestation, unspecified trimester: Secondary | ICD-10-CM

## 2019-04-21 DIAGNOSIS — O4703 False labor before 37 completed weeks of gestation, third trimester: Secondary | ICD-10-CM | POA: Diagnosis not present

## 2019-04-21 DIAGNOSIS — O479 False labor, unspecified: Secondary | ICD-10-CM

## 2019-04-21 DIAGNOSIS — R05 Cough: Secondary | ICD-10-CM | POA: Diagnosis not present

## 2019-04-21 DIAGNOSIS — O26893 Other specified pregnancy related conditions, third trimester: Secondary | ICD-10-CM | POA: Diagnosis not present

## 2019-04-21 DIAGNOSIS — Z3689 Encounter for other specified antenatal screening: Secondary | ICD-10-CM

## 2019-04-21 LAB — URINALYSIS, ROUTINE W REFLEX MICROSCOPIC
Bacteria, UA: NONE SEEN
Bilirubin Urine: NEGATIVE
Glucose, UA: NEGATIVE mg/dL
Ketones, ur: NEGATIVE mg/dL
Leukocytes,Ua: NEGATIVE
Nitrite: NEGATIVE
Protein, ur: NEGATIVE mg/dL
Specific Gravity, Urine: 1.008 (ref 1.005–1.030)
pH: 7 (ref 5.0–8.0)

## 2019-04-21 LAB — CULTURE, BETA STREP (GROUP B ONLY)

## 2019-04-21 MED ORDER — MENTHOL 3 MG MT LOZG
1.0000 | LOZENGE | OROMUCOSAL | Status: DC | PRN
  Administered 2019-04-21: 23:00:00 3 mg via ORAL
  Filled 2019-04-21: qty 9

## 2019-04-21 MED ORDER — FAMOTIDINE IN NACL 20-0.9 MG/50ML-% IV SOLN
20.0000 mg | Freq: Once | INTRAVENOUS | Status: AC
  Administered 2019-04-21: 23:00:00 20 mg via INTRAVENOUS
  Filled 2019-04-21: qty 50

## 2019-04-21 MED ORDER — LACTATED RINGERS IV BOLUS
1000.0000 mL | Freq: Once | INTRAVENOUS | Status: AC
  Administered 2019-04-21: 21:00:00 1000 mL via INTRAVENOUS

## 2019-04-21 MED ORDER — LACTATED RINGERS IV SOLN
Freq: Once | INTRAVENOUS | Status: AC
  Administered 2019-04-21: 23:00:00 via INTRAVENOUS

## 2019-04-21 NOTE — MAU Provider Note (Signed)
History     CSN: [id number]  Arrival date and time: 04/21/19 2039   First Provider Initiated Contact with Patient 04/21/19 2109      Chief Complaint  Patient presents with  . Contractions   HPI Maria Walton is a 33 y.o. G2P1001 at [redacted]w[redacted]d who presents to MAU with chief complaint of preterm contractions. This a recurring problem for which patient was admitted 04/17/19 and discharged 04/19/19. She reports a recurrence of preterm contractions which began this morning. They have been consistent throughout the day "not getting any closer but not spacing out either". Upon arrival in MAU she rates her contraction pain as 5/10. She took Procardia as previously advised around 5pm but did not notice an improvement in her pain.  Patient also complains of recurrent dry cough. This has been a problem throughout "the whole last half of my pregnancy". She typically manages her cough with Pepcid as she believes it is associated with her GERD, but she has not taken Pepcid today.  OB History    Gravida  2   Para  1   Term  1   Preterm      AB      Living  1     SAB      TAB      Ectopic      Multiple  0   Live Births  1           Past Medical History:  Diagnosis Date  . Anemia    Only with this pregnancy; taking iron supplements  . Complication of anesthesia    N/V; rash  . Dizziness   . Endometriosis   . Headache   . Heart palpitations     Past Surgical History:  Procedure Laterality Date  . APPENDECTOMY     dr Lovell Sheehan 1999  . CHOLECYSTECTOMY  06/05/2012   Procedure: LAPAROSCOPIC CHOLECYSTECTOMY;  Surgeon: Marlane Hatcher, MD;  Location: [facility];  Service: General;  Laterality: N/A;  . PELVIC LAPAROSCOPY  2017   Endometriosis    Family History  Problem Relation Age of Onset  . Hypertension Father   . Diabetes Maternal Grandmother   . Heart disease Maternal Grandmother   . Hypertension Paternal Grandfather   . Breast cancer Paternal Grandmother 47     Social History   Tobacco Use  . Smoking status: Never Smoker  . Smokeless tobacco: Never Used  Substance Use Topics  . Alcohol use: No    Alcohol/week: 0.0 standard drinks  . Drug use: No    Allergies:  Allergies  Allergen Reactions  . Ceclor [Cefaclor] Hives    Childhood allergy  . Promethazine Hcl Other (See Comments)    hyperactivity  . Latex Rash    Rash when using latex gloves    Medications Prior to Admission  Medication Sig Dispense Refill Last Dose  . calcium carbonate (TUMS - DOSED IN MG ELEMENTAL CALCIUM) 500 MG chewable tablet Chew 1 tablet by mouth at bedtime.   Past Week at Unknown time  . Cyanocobalamin (VITAMIN B-12) 1000 MCG SUBL Place 1 tablet under the tongue at bedtime.   Past Week at Unknown time  . Ferrous Sulfate (IRON) 325 (65 Fe) MG TABS Take 1 tablet by mouth daily.   Past Week at Unknown time  . Multiple Vitamins-Minerals (ADULT ONE DAILY GUMMIES) CHEW Chew 2 tablets by mouth daily.   04/16/2019  . NIFEdipine (PROCARDIA) 10 MG capsule Take 1 capsule (10 mg total) by mouth  every 4 (four) hours as needed. 30 capsule 0     Review of Systems  Constitutional: Negative for chills and fever.  Respiratory: Positive for cough. Negative for shortness of breath.   Gastrointestinal: Positive for abdominal pain.  Genitourinary: Negative for vaginal bleeding, vaginal discharge and vaginal pain.  Musculoskeletal: Positive for back pain.  Neurological: Negative for headaches.  All other systems reviewed and are negative.  Physical Exam   Blood pressure 118/72, pulse (!) 104, temperature 98.4 F (36.9 C), resp. rate 18, height 5\' 7"  (1.702 m), weight 57.1 kg, last menstrual period 08/21/2018, not currently breastfeeding.  Physical Exam  Nursing note and vitals reviewed. Constitutional: She is oriented to person, place, and time.  Cardiovascular: Normal rate.  Respiratory: Effort normal. No respiratory distress.  GI: She exhibits no distension. There is  no abdominal tenderness. There is no rebound and no guarding.  gravid  Genitourinary:    Vagina and uterus normal.   Neurological: She is alert and oriented to person, place, and time.  Skin: Skin is warm and dry.  Psychiatric: She has a normal mood and affect. Her behavior is normal. Judgment and thought content normal.    MAU Course/MDM  Procedures  -Patient given Betamethasone x 2, Procardia and Magnesium Sulfate for neuroprotection as part of admission 05/05-05/08 --Baseline 135, moderate variability, positive accelerations, no decelerations --Toco: irregular contractions q 2-9 min --Patient's cervix noted to be 3.5cm on hospital discharge. Remains 3.5cm s/p 3.5 hours monitoring  Patient Vitals for the past 24 hrs:  BP Temp Pulse Resp SpO2 Height Weight  04/22/19 0051 96/63 - 74 - 99 % - -  04/21/19 2051 118/72 - (!) 104 - - - -  04/21/19 2048 - 98.4 F (36.9 C) - 18 - 5\' 7"  (1.702 m) 57.1 kg    Results for orders placed or performed during the hospital encounter of 04/21/19 (from the past 24 hour(s))  Urinalysis, Routine w reflex microscopic     Status: Abnormal   Collection Time: 04/21/19  9:16 PM  Result Value Ref Range   Color, Urine STRAW (A) YELLOW   APPearance CLEAR CLEAR   Specific Gravity, Urine 1.008 1.005 - 1.030   pH 7.0 5.0 - 8.0   Glucose, UA NEGATIVE NEGATIVE mg/dL   Hgb urine dipstick SMALL (A) NEGATIVE   Bilirubin Urine NEGATIVE NEGATIVE   Ketones, ur NEGATIVE NEGATIVE mg/dL   Protein, ur NEGATIVE NEGATIVE mg/dL   Nitrite NEGATIVE NEGATIVE   Leukocytes,Ua NEGATIVE NEGATIVE   RBC / HPF 0-5 0 - 5 RBC/hpf   WBC, UA 0-5 0 - 5 WBC/hpf   Bacteria, UA NONE SEEN NONE SEEN   Squamous Epithelial / LPF 0-5 0 - 5   Meds ordered this encounter  Medications  . lactated ringers bolus 1,000 mL  . famotidine (PEPCID) IVPB 20 mg premix  . menthol-cetylpyridinium (CEPACOL) lozenge 3 mg  . lactated ringers infusion    Assessment and Plan  --33 y.o. G2P1001 at  3151w6d  --Reactive tracing --Patient has declined all interventions beyond IV fluids --No cervical change after 3.5 hours --Discharge home in stable condition with preterm labor precautions  F/U: Patient has OB appt Thursday 04/26/19  Calvert CantorSamantha C Jamarri Vuncannon, CNM 04/22/2019, 1:17 AM

## 2019-04-21 NOTE — MAU Note (Signed)
Was seen here Tues and admitted for PTL. Went home [employer]. Ctxs every 8-55mins most of the day. Taking procardia as needed. Pushed flds all day and took PRocardia about 1700. Some lower back pain but has been on bedrest since going home. Has been light headed some. Did not take another procardia due to b/p tends to run lower. Stomach feels like "in knots" at times and different from ctxs. Was 4cm last sve. Denies vag bleeding or LOF

## 2019-04-22 ENCOUNTER — Inpatient Hospital Stay (EMERGENCY_DEPARTMENT_HOSPITAL)
Admission: AD | Admit: 2019-04-22 | Discharge: 2019-04-22 | Disposition: A | Discharge status: Home / Self Care | Payer: 59 | Source: Home / Self Care | Attending: Obstetrics and Gynecology | Admitting: Obstetrics and Gynecology

## 2019-04-22 ENCOUNTER — Encounter (HOSPITAL_COMMUNITY): Payer: Self-pay | Admitting: *Deleted

## 2019-04-22 ENCOUNTER — Other Ambulatory Visit: Payer: Self-pay

## 2019-04-22 DIAGNOSIS — Z3A34 34 weeks gestation of pregnancy: Secondary | ICD-10-CM

## 2019-04-22 DIAGNOSIS — O4703 False labor before 37 completed weeks of gestation, third trimester: Secondary | ICD-10-CM

## 2019-04-22 NOTE — Discharge Instructions (Signed)
Preterm Labor and Birth Information °Pregnancy normally lasts 39-41 weeks. Preterm labor is when labor starts early. It starts before you have been pregnant for 37 whole weeks. °What are the risk factors for preterm labor? °Preterm labor is more likely to occur in women who: °· Have an infection while pregnant. °· Have a cervix that is short. °· Have gone into preterm labor before. °· Have had surgery on their cervix. °· Are younger than age 32. °· Are older than age 35. °· Are African American. °· Are pregnant with two or more babies. °· Take street drugs while pregnant. °· Smoke while pregnant. °· Do not gain enough weight while pregnant. °· Got pregnant right after another pregnancy. °What are the symptoms of preterm labor? °Symptoms of preterm labor include: °· Cramps. The cramps may feel like the cramps some women get during their period. The cramps may happen with watery poop (diarrhea). °· Pain in the belly (abdomen). °· Pain in the lower back. °· Regular contractions or tightening. It may feel like your belly is getting tighter. °· Pressure in the lower belly that seems to get stronger. °· More fluid (discharge) leaking from the vagina. The fluid may be watery or bloody. °· Water breaking. °Why is it important to notice signs of preterm labor? °Babies who are born early may not be fully developed. They have a higher chance for: °· Long-term heart problems. °· Long-term lung problems. °· Trouble controlling body systems, like breathing. °· Bleeding in the brain. °· A condition called cerebral palsy. °· Learning difficulties. °· Death. °These risks are highest for babies who are born before 34 weeks of pregnancy. °How is preterm labor treated? °Treatment depends on: °· How long you were pregnant. °· Your condition. °· The health of your baby. °Treatment may involve: °· Having a stitch (suture) placed in your cervix. When you give birth, your cervix opens so the baby can come out. The stitch keeps the cervix  from opening too soon. °· Staying at the hospital. °· Taking or getting medicines, such as: °? Hormone medicines. °? Medicines to stop contractions. °? Medicines to help the baby’s lungs develop. °? Medicines to prevent your baby from having cerebral palsy. °What should I do if I am in preterm labor? °If you think you are going into labor too soon, call your doctor right away. °How can I prevent preterm labor? °· Do not use any tobacco products. °? Examples of these are cigarettes, chewing tobacco, and e-cigarettes. °? If you need help quitting, ask your doctor. °· Do not use street drugs. °· Do not use any medicines unless you ask your doctor if they are safe for you. °· Talk with your doctor before taking any herbal supplements. °· Make sure you gain enough weight. °· Watch for infection. If you think you might have an infection, get it checked right away. °· If you have gone into preterm labor before, tell your doctor. °This information is not intended to replace advice given to you by your health care provider. Make sure you discuss any questions you have with your health care provider. °Document Released: 02/25/2009 Document Revised: 05/11/2016 Document Reviewed: 04/21/2016 °Elsevier Interactive Patient Education © 2019 [employer] Inc[employer] ° °

## 2019-04-22 NOTE — MAU Note (Signed)
Pt was discharged last night with similar complaints.  She has continued to have contractions with back pain and lower abdominal cramping.  She saw some bloody discharge today.  Denies LOF.  Reports good fetal movement.  Rating her pain a 5/10.

## 2019-04-22 NOTE — MAU Provider Note (Signed)
History     CSN: [id number]  Arrival date and time: 04/22/19 1441   First Provider Initiated Contact with Patient 04/22/19 1513      Chief Complaint  Patient presents with  . Contractions  . Back Pain  . Vaginal Bleeding   Maria Walton is a 33 y.o. G2P1001 at [redacted]w[redacted]d who presents today after she had some spotting she noticed when she wiped. She was seen here on 04/17/2019 and was admitted for threatened preterm labor. She received mag and BMZ. She was then DC home. She returned last evening 04/21/2019 with contractions. She was observed here for several hours without cervical change. She is back again today with spotting. She states that the contractions and back pain are about the same. They are not any worse than before. However, she had some bleeding today, and wasn't sure if it was from multiple cervical exams or if there was more change occurring. She reports normal fetal movement. She denies any LOF.   Back Pain  Associated symptoms include pelvic pain. Pertinent negatives include no dysuria or fever.  Vaginal Bleeding  The patient's primary symptoms include pelvic pain and vaginal bleeding. This is a new problem. The current episode started today. The problem occurs intermittently. The problem has been unchanged. The pain is mild. She is pregnant. Associated symptoms include back pain. Pertinent negatives include no chills, dysuria, fever, frequency, nausea, urgency or vomiting. The vaginal discharge was bloody. The vaginal bleeding is spotting. She has not been passing clots. She has not been passing tissue. Nothing aggravates the symptoms. She has tried nothing for the symptoms.    OB History    Gravida  2   Para  1   Term  1   Preterm      AB      Living  1     SAB      TAB      Ectopic      Multiple  0   Live Births  1           Past Medical History:  Diagnosis Date  . Anemia    Only with this pregnancy; taking iron supplements  . Complication of  anesthesia    N/V; rash  . Dizziness   . Endometriosis   . Headache   . Heart palpitations     Past Surgical History:  Procedure Laterality Date  . APPENDECTOMY     dr Lovell Sheehan 1999  . CHOLECYSTECTOMY  06/05/2012   Procedure: LAPAROSCOPIC CHOLECYSTECTOMY;  Surgeon: Marlane Hatcher, MD;  Location: [facility];  Service: General;  Laterality: N/A;  . PELVIC LAPAROSCOPY  2017   Endometriosis    Family History  Problem Relation Age of Onset  . Hypertension Father   . Diabetes Maternal Grandmother   . Heart disease Maternal Grandmother   . Hypertension Paternal Grandfather   . Breast cancer Paternal Grandmother 66    Social History   Tobacco Use  . Smoking status: Never Smoker  . Smokeless tobacco: Never Used  Substance Use Topics  . Alcohol use: No    Alcohol/week: 0.0 standard drinks  . Drug use: No    Allergies:  Allergies  Allergen Reactions  . Ceclor [Cefaclor] Hives    Childhood allergy  . Promethazine Hcl Other (See Comments)    hyperactivity  . Latex Rash    Rash when using latex gloves    Medications Prior to Admission  Medication Sig Dispense Refill Last Dose  . calcium  carbonate (TUMS - DOSED IN MG ELEMENTAL CALCIUM) 500 MG chewable tablet Chew 1 tablet by mouth at bedtime.   Past Week at Unknown time  . Cyanocobalamin (VITAMIN B-12) 1000 MCG SUBL Place 1 tablet under the tongue at bedtime.   Past Week at Unknown time  . Ferrous Sulfate (IRON) 325 (65 Fe) MG TABS Take 1 tablet by mouth daily.   Past Week at Unknown time  . Multiple Vitamins-Minerals (ADULT ONE DAILY GUMMIES) CHEW Chew 2 tablets by mouth daily.   04/16/2019  . NIFEdipine (PROCARDIA) 10 MG capsule Take 1 capsule (10 mg total) by mouth every 4 (four) hours as needed. 30 capsule 0     Review of Systems  Constitutional: Negative for chills and fever.  Gastrointestinal: Negative for nausea and vomiting.  Genitourinary: Positive for pelvic pain and vaginal bleeding. Negative for dysuria,  frequency and urgency.  Musculoskeletal: Positive for back pain.   Physical Exam   Blood pressure 111/69, pulse 83, temperature 98.1 F (36.7 C), temperature source Oral, resp. rate 16, last menstrual period 08/21/2018, SpO2 100 %, not currently breastfeeding.  Physical Exam  Nursing note and vitals reviewed. Constitutional: She is oriented to person, place, and time. She appears well-developed and well-nourished. No distress.  HENT:  Head: Normocephalic.  Cardiovascular: Normal rate.  Respiratory: Effort normal.  GI: Soft. There is no abdominal tenderness. There is no rebound.  Genitourinary:    Genitourinary Comments: Cervical exam: 3@external  os, but feels closed at internal os, fetal head is very low (0 station) and cervix is behind the head. So exam was a little difficult. However, I was unable to get all the way through the internal os.    Neurological: She is alert and oriented to person, place, and time.  Skin: Skin is warm and dry.  Psychiatric: She has a normal mood and affect.    NST:  Baseline: 145 Variability: moderate Accels: 15x15 Decels: none Toco: about every 7 mins, and this agrees with what the patient reports of about every 8 mins at home.   MAU Course  Procedures  MDM  Assessment and Plan   1. Threatened premature labor in third trimester   2. [redacted] weeks gestation of pregnancy    DC home Comfort measures reviewed  Patient is ok to take procardia as needed for discomfort 3rd Trimester precautions  Bleeding precautions PTL precautions  Fetal kick counts RX: none  Return to MAU as needed FU with OB as planned  Follow-up Information    Harold Hedgeomblin, James, MD Follow up.   Specialty:  Obstetrics and Gynecology Contact information: [address] [place] [address] [phone]          Thressa ShellerHeather Montgomery Favor DNP, CNM  04/22/19  3:37 PM

## 2019-04-22 NOTE — Progress Notes (Signed)
Pt sat up in bed and FHM tracing maternal FHR. EFM dc for preparation for d/c home.

## 2019-04-26 DIAGNOSIS — Z3685 Encounter for antenatal screening for Streptococcus B: Secondary | ICD-10-CM | POA: Diagnosis not present

## 2019-04-26 LAB — OB RESULTS CONSOLE GBS: GBS: NEGATIVE

## 2019-05-04 ENCOUNTER — Inpatient Hospital Stay (HOSPITAL_COMMUNITY)
Admission: AD | Admit: 2019-05-04 | Discharge: 2019-05-04 | Disposition: A | Discharge status: Home / Self Care | Payer: 59 | Source: Ambulatory Visit | Attending: Obstetrics and Gynecology | Admitting: Obstetrics and Gynecology

## 2019-05-04 ENCOUNTER — Other Ambulatory Visit: Payer: Self-pay

## 2019-05-04 ENCOUNTER — Encounter (HOSPITAL_COMMUNITY): Payer: Self-pay

## 2019-05-04 DIAGNOSIS — O479 False labor, unspecified: Secondary | ICD-10-CM

## 2019-05-04 DIAGNOSIS — O4703 False labor before 37 completed weeks of gestation, third trimester: Secondary | ICD-10-CM | POA: Diagnosis not present

## 2019-05-04 DIAGNOSIS — Z3689 Encounter for other specified antenatal screening: Secondary | ICD-10-CM

## 2019-05-04 DIAGNOSIS — Z3A36 36 weeks gestation of pregnancy: Secondary | ICD-10-CM | POA: Diagnosis not present

## 2019-05-04 LAB — URINALYSIS, ROUTINE W REFLEX MICROSCOPIC
Bilirubin Urine: NEGATIVE
Glucose, UA: NEGATIVE mg/dL
Hgb urine dipstick: NEGATIVE
Ketones, ur: 20 mg/dL — AB
Leukocytes,Ua: NEGATIVE
Nitrite: NEGATIVE
Protein, ur: NEGATIVE mg/dL
Specific Gravity, Urine: 1.012 (ref 1.005–1.030)
pH: 6 (ref 5.0–8.0)

## 2019-05-04 NOTE — Discharge Instructions (Signed)
Preterm Labor and Birth Information °Pregnancy normally lasts 39-41 weeks. Preterm labor is when labor starts early. It starts before you have been pregnant for 37 whole weeks. °What are the risk factors for preterm labor? °Preterm labor is more likely to occur in women who: °· Have an infection while pregnant. °· Have a cervix that is short. °· Have gone into preterm labor before. °· Have had surgery on their cervix. °· Are younger than age 32. °· Are older than age 35. °· Are African American. °· Are pregnant with two or more babies. °· Take street drugs while pregnant. °· Smoke while pregnant. °· Do not gain enough weight while pregnant. °· Got pregnant right after another pregnancy. °What are the symptoms of preterm labor? °Symptoms of preterm labor include: °· Cramps. The cramps may feel like the cramps some women get during their period. The cramps may happen with watery poop (diarrhea). °· Pain in the belly (abdomen). °· Pain in the lower back. °· Regular contractions or tightening. It may feel like your belly is getting tighter. °· Pressure in the lower belly that seems to get stronger. °· More fluid (discharge) leaking from the vagina. The fluid may be watery or bloody. °· Water breaking. °Why is it important to notice signs of preterm labor? °Babies who are born early may not be fully developed. They have a higher chance for: °· Long-term heart problems. °· Long-term lung problems. °· Trouble controlling body systems, like breathing. °· Bleeding in the brain. °· A condition called cerebral palsy. °· Learning difficulties. °· Death. °These risks are highest for babies who are born before 34 weeks of pregnancy. °How is preterm labor treated? °Treatment depends on: °· How long you were pregnant. °· Your condition. °· The health of your baby. °Treatment may involve: °· Having a stitch (suture) placed in your cervix. When you give birth, your cervix opens so the baby can come out. The stitch keeps the cervix  from opening too soon. °· Staying at the hospital. °· Taking or getting medicines, such as: °? Hormone medicines. °? Medicines to stop contractions. °? Medicines to help the baby’s lungs develop. °? Medicines to prevent your baby from having cerebral palsy. °What should I do if I am in preterm labor? °If you think you are going into labor too soon, call your doctor right away. °How can I prevent preterm labor? °· Do not use any tobacco products. °? Examples of these are cigarettes, chewing tobacco, and e-cigarettes. °? If you need help quitting, ask your doctor. °· Do not use street drugs. °· Do not use any medicines unless you ask your doctor if they are safe for you. °· Talk with your doctor before taking any herbal supplements. °· Make sure you gain enough weight. °· Watch for infection. If you think you might have an infection, get it checked right away. °· If you have gone into preterm labor before, tell your doctor. °This information is not intended to replace advice given to you by your health care provider. Make sure you discuss any questions you have with your health care provider. °Document Released: 02/25/2009 Document Revised: 05/11/2016 Document Reviewed: 04/21/2016 °Elsevier Interactive Patient Education © 2019 [employer] Inc[employer] ° °

## 2019-05-04 NOTE — MAU Provider Note (Signed)
S: Ms. Maria Walton is a 33 y.o. G2P1001 at [redacted]w[redacted]d  who presents to MAU today for labor evaluation.    RN note: Ctxs all day but closer since 0100. Denies LOF or bleeding. 3cm this past Weds.   Cervical exam by RN:  Dilation: 3 Effacement (%): 80 Cervical Position: Posterior Station: -1 Presentation: Vertex Exam by:: Pharmacologist  Fetal Monitoring: Baseline: 140 Variability: average Accelerations: present Decelerations: absent Contractions: irregular, Q 3-26min  MDM Discussed patient with RN. NST reviewed.   A: SIUP at [redacted]w[redacted]d  False labor  P: Discharge home Labor precautions and kick counts included in AVS Patient to follow-up with Dr Elon Spanner as scheduled  Patient may return to MAU as needed or when in labor   Aviva Signs, CNM 05/04/2019 4:59 AM

## 2019-05-04 NOTE — MAU Note (Signed)
Ctxs all day but closer since 0100. Denies LOF or bleeding. 3cm this past Weds.

## 2019-05-11 ENCOUNTER — Encounter (HOSPITAL_COMMUNITY): Payer: Self-pay | Admitting: *Deleted

## 2019-05-11 ENCOUNTER — Other Ambulatory Visit: Payer: Self-pay

## 2019-05-11 ENCOUNTER — Inpatient Hospital Stay (HOSPITAL_COMMUNITY)
Admission: AD | Admit: 2019-05-11 | Discharge: 2019-05-11 | Disposition: A | Discharge status: Home / Self Care | Payer: 59 | Source: Ambulatory Visit | Attending: Obstetrics & Gynecology | Admitting: Obstetrics & Gynecology

## 2019-05-11 DIAGNOSIS — O471 False labor at or after 37 completed weeks of gestation: Secondary | ICD-10-CM | POA: Diagnosis not present

## 2019-05-11 DIAGNOSIS — Z3A37 37 weeks gestation of pregnancy: Secondary | ICD-10-CM | POA: Insufficient documentation

## 2019-05-11 DIAGNOSIS — O479 False labor, unspecified: Secondary | ICD-10-CM | POA: Diagnosis not present

## 2019-05-11 NOTE — MAU Provider Note (Signed)
S: Ms. Maria Walton is a 33 y.o. G2P1001 at [redacted]w[redacted]d  who presents to MAU today for labor evaluation.     Cervical exam by RN:  Dilation: 3.5 Effacement (%): 80 Cervical Position: Middle Station: -1 Presentation: Vertex Exam by:: T Lytle RN   Fetal Monitoring: Baseline: 140 Variability: average Accelerations: present Decelerations: absent Contractions: irregular  MDM Discussed patient with RN. NST reviewed.   A: SIUP at [redacted]w[redacted]d  False labor  P: Discharge home Labor precautions and kick counts included in AVS Patient to follow-up with office as scheduled  Patient may return to MAU as needed or when in labor   Aviva Signs, [place] 05/11/2019 7:04 AM

## 2019-05-11 NOTE — Discharge Instructions (Signed)

## 2019-05-11 NOTE — MAU Note (Signed)
Ctxs all day Thurs. Closer since 0130. Had sve Tues and bloody show since then. Was 3.5cm and 90% with BOWB. Denies LOF.

## 2019-05-11 NOTE — MAU Note (Signed)
I have communicated with Wynelle Bourgeois, CNM and reviewed vital signs:  Vitals:   05/11/19 0707 05/11/19 0717  BP: 113/75 113/75  Pulse: (!) 108   Resp:    Temp:      Vaginal exam:  Dilation: 3.5 Effacement (%): 80 Cervical Position: Middle Station: -1 Presentation: Vertex Exam by:: T Velia Meyer RN ,   Also reviewed contraction pattern and that non-stress test is reactive.  It has been documented that patient is contracting irregularly with no  cervical change over 1 hours not indicating active labor.  Patient denies any other complaints.  Based on this report provider has given order for discharge.  A discharge order and diagnosis entered by a provider.   Labor discharge instructions reviewed with patient.

## 2019-05-16 ENCOUNTER — Other Ambulatory Visit: Payer: Self-pay

## 2019-05-16 ENCOUNTER — Encounter (HOSPITAL_COMMUNITY): Payer: Self-pay

## 2019-05-16 ENCOUNTER — Inpatient Hospital Stay (HOSPITAL_COMMUNITY): Payer: 59 | Admitting: Anesthesiology

## 2019-05-16 ENCOUNTER — Inpatient Hospital Stay (HOSPITAL_COMMUNITY)
Admission: AD | Admit: 2019-05-16 | Discharge: 2019-05-17 | DRG: 807 | Disposition: A | Discharge status: Home / Self Care | Payer: 59 | Attending: Obstetrics and Gynecology | Admitting: Obstetrics and Gynecology

## 2019-05-16 DIAGNOSIS — Z3A38 38 weeks gestation of pregnancy: Secondary | ICD-10-CM

## 2019-05-16 DIAGNOSIS — Z1159 Encounter for screening for other viral diseases: Secondary | ICD-10-CM

## 2019-05-16 DIAGNOSIS — O26893 Other specified pregnancy related conditions, third trimester: Secondary | ICD-10-CM | POA: Diagnosis present

## 2019-05-16 LAB — CBC
HCT: 39.1 % (ref 36.0–46.0)
Hemoglobin: 12.8 g/dL (ref 12.0–15.0)
MCH: 29.4 pg (ref 26.0–34.0)
MCHC: 32.7 g/dL (ref 30.0–36.0)
MCV: 89.7 fL (ref 80.0–100.0)
Platelets: 274 10*3/uL (ref 150–400)
RBC: 4.36 MIL/uL (ref 3.87–5.11)
RDW: 14.1 % (ref 11.5–15.5)
WBC: 10.7 10*3/uL — ABNORMAL HIGH (ref 4.0–10.5)
nRBC: 0 % (ref 0.0–0.2)

## 2019-05-16 LAB — SARS CORONAVIRUS 2: SARS Coronavirus 2: NOT DETECTED

## 2019-05-16 LAB — TYPE AND SCREEN
ABO/RH(D): O POS
Antibody Screen: NEGATIVE

## 2019-05-16 LAB — RPR: RPR Ser Ql: NONREACTIVE

## 2019-05-16 MED ORDER — SIMETHICONE 80 MG PO CHEW: 80.0000 mg | CHEWABLE_TABLET | ORAL | Status: DC | PRN

## 2019-05-16 MED ORDER — ONDANSETRON HCL 4 MG/2ML IJ SOLN: 4.0000 mg | INTRAMUSCULAR | Status: DC | PRN

## 2019-05-16 MED ORDER — OXYTOCIN 40 UNITS IN NORMAL SALINE INFUSION - SIMPLE MED
INTRAVENOUS | Status: AC
  Filled 2019-05-16: qty 1000

## 2019-05-16 MED ORDER — SODIUM CHLORIDE (PF) 0.9 % IJ SOLN
INTRAMUSCULAR | Status: DC | PRN
  Administered 2019-05-16: 12 mL/h via EPIDURAL

## 2019-05-16 MED ORDER — ONDANSETRON HCL 4 MG PO TABS: 4.0000 mg | ORAL_TABLET | ORAL | Status: DC | PRN

## 2019-05-16 MED ORDER — FENTANYL-BUPIVACAINE-NACL 0.5-0.125-0.9 MG/250ML-% EP SOLN
12.0000 mL/h | EPIDURAL | Status: DC | PRN
  Filled 2019-05-16: qty 250

## 2019-05-16 MED ORDER — OXYTOCIN BOLUS FROM INFUSION: 500.0000 mL | Freq: Once | INTRAVENOUS | Status: DC

## 2019-05-16 MED ORDER — OXYCODONE-ACETAMINOPHEN 5-325 MG PO TABS: 2.0000 | ORAL_TABLET | ORAL | Status: DC | PRN

## 2019-05-16 MED ORDER — DIPHENHYDRAMINE HCL 25 MG PO CAPS: 25.0000 mg | ORAL_CAPSULE | Freq: Four times a day (QID) | ORAL | Status: DC | PRN

## 2019-05-16 MED ORDER — PHENYLEPHRINE 40 MCG/ML (10ML) SYRINGE FOR IV PUSH (FOR BLOOD PRESSURE SUPPORT): 80.0000 ug | PREFILLED_SYRINGE | INTRAVENOUS | Status: DC | PRN

## 2019-05-16 MED ORDER — SOD CITRATE-CITRIC ACID 500-334 MG/5ML PO SOLN: 30.0000 mL | ORAL | Status: DC | PRN

## 2019-05-16 MED ORDER — SENNOSIDES-DOCUSATE SODIUM 8.6-50 MG PO TABS: 2.0000 | ORAL_TABLET | ORAL | Status: DC

## 2019-05-16 MED ORDER — EPHEDRINE 5 MG/ML INJ: 10.0000 mg | INTRAVENOUS | Status: DC | PRN

## 2019-05-16 MED ORDER — OXYCODONE-ACETAMINOPHEN 5-325 MG PO TABS: 1.0000 | ORAL_TABLET | ORAL | Status: DC | PRN

## 2019-05-16 MED ORDER — LACTATED RINGERS IV SOLN: 500.0000 mL | Freq: Once | INTRAVENOUS | Status: DC

## 2019-05-16 MED ORDER — BENZOCAINE-MENTHOL 20-0.5 % EX AERO
1.0000 "application " | INHALATION_SPRAY | CUTANEOUS | Status: DC | PRN
  Administered 2019-05-16: 1 via TOPICAL
  Filled 2019-05-16: qty 56

## 2019-05-16 MED ORDER — FLEET ENEMA 7-19 GM/118ML RE ENEM: 1.0000 | ENEMA | RECTAL | Status: DC | PRN

## 2019-05-16 MED ORDER — BISACODYL 10 MG RE SUPP: 10.0000 mg | Freq: Every day | RECTAL | Status: DC | PRN

## 2019-05-16 MED ORDER — PHENYLEPHRINE 40 MCG/ML (10ML) SYRINGE FOR IV PUSH (FOR BLOOD PRESSURE SUPPORT)
80.0000 ug | PREFILLED_SYRINGE | INTRAVENOUS | Status: DC | PRN
  Filled 2019-05-16: qty 10

## 2019-05-16 MED ORDER — MEDROXYPROGESTERONE ACETATE 150 MG/ML IM SUSP: 150.0000 mg | INTRAMUSCULAR | Status: DC | PRN

## 2019-05-16 MED ORDER — COCONUT OIL OIL: 1.0000 "application " | TOPICAL_OIL | Status: DC | PRN

## 2019-05-16 MED ORDER — LIDOCAINE HCL (PF) 1 % IJ SOLN
INTRAMUSCULAR | Status: DC | PRN
  Administered 2019-05-16: 11 mL via EPIDURAL

## 2019-05-16 MED ORDER — LIDOCAINE HCL (PF) 1 % IJ SOLN: 30.0000 mL | INTRAMUSCULAR | Status: DC | PRN

## 2019-05-16 MED ORDER — FLEET ENEMA 7-19 GM/118ML RE ENEM: 1.0000 | ENEMA | Freq: Every day | RECTAL | Status: DC | PRN

## 2019-05-16 MED ORDER — ONDANSETRON HCL 4 MG/2ML IJ SOLN: 4.0000 mg | Freq: Four times a day (QID) | INTRAMUSCULAR | Status: DC | PRN

## 2019-05-16 MED ORDER — LACTATED RINGERS IV SOLN: 500.0000 mL | INTRAVENOUS | Status: DC | PRN

## 2019-05-16 MED ORDER — MEASLES, MUMPS & RUBELLA VAC IJ SOLR: 0.5000 mL | Freq: Once | INTRAMUSCULAR | Status: DC

## 2019-05-16 MED ORDER — DIBUCAINE (PERIANAL) 1 % EX OINT: 1.0000 "application " | TOPICAL_OINTMENT | CUTANEOUS | Status: DC | PRN

## 2019-05-16 MED ORDER — OXYTOCIN 40 UNITS IN NORMAL SALINE INFUSION - SIMPLE MED: 2.5000 [IU]/h | INTRAVENOUS | Status: DC

## 2019-05-16 MED ORDER — ACETAMINOPHEN 325 MG PO TABS: 650.0000 mg | ORAL_TABLET | ORAL | Status: DC | PRN

## 2019-05-16 MED ORDER — ZOLPIDEM TARTRATE 5 MG PO TABS: 5.0000 mg | ORAL_TABLET | Freq: Every evening | ORAL | Status: DC | PRN

## 2019-05-16 MED ORDER — IBUPROFEN 600 MG PO TABS
600.0000 mg | ORAL_TABLET | Freq: Four times a day (QID) | ORAL | Status: DC
  Administered 2019-05-16 – 2019-05-17 (×4): 600 mg via ORAL
  Filled 2019-05-16 (×4): qty 1

## 2019-05-16 MED ORDER — PRENATAL MULTIVITAMIN CH
1.0000 | ORAL_TABLET | Freq: Every day | ORAL | Status: DC
  Filled 2019-05-16: qty 1

## 2019-05-16 MED ORDER — WITCH HAZEL-GLYCERIN EX PADS: 1.0000 "application " | MEDICATED_PAD | CUTANEOUS | Status: DC | PRN

## 2019-05-16 MED ORDER — TETANUS-DIPHTH-ACELL PERTUSSIS 5-2.5-18.5 LF-MCG/0.5 IM SUSP: 0.5000 mL | Freq: Once | INTRAMUSCULAR | Status: DC

## 2019-05-16 MED ORDER — DIPHENHYDRAMINE HCL 50 MG/ML IJ SOLN: 12.5000 mg | INTRAMUSCULAR | Status: DC | PRN

## 2019-05-16 MED ORDER — LACTATED RINGERS IV SOLN
INTRAVENOUS | Status: DC
  Administered 2019-05-16: 07:00:00 125 mL/h via INTRAVENOUS

## 2019-05-16 NOTE — Lactation Note (Signed)
This note was copied from a baby's chart. Lactation Consultation Note  Patient Name: Maria Walton BTYOM'A Date: 05/16/2019 Reason for consult: Initial assessment;Early term 67-38.6wks  P2 mother whose infant is now 27 hours old.  Mother breast fed her first child (now 22 months) for a couple of weeks only.  Mother had just finished breast feeding prior to my arrival.  Father was changing her diaper.  Mother stated that baby has breast fed well twice since delivery.  She denies pain with latching.  Encouraged her to feed 8-12 times/24 hours or sooner if baby shows feeding cues.  Mother is familiar with feeding cues and hand expression and did not wish to review.  Suggested she have her RN observe latch when she is in the room.  Encouraged mother to call for assistance as needed.  Mother has a DEBP for home use.  Mom made aware of O/P services, breastfeeding support groups, community resources, and our phone # for post-discharge questions. Father present.   Maternal Data Formula Feeding for Exclusion: No Has patient been taught Hand Expression?: Yes Does the patient have breastfeeding experience prior to this delivery?: Yes  Feeding    LATCH Score                   Interventions    Lactation Tools Discussed/Used     Consult Status Consult Status: Follow-up Date: 05/17/19 Follow-up type: In-patient    Deirdre Gryder R Chia Rock 05/16/2019, 6:10 PM

## 2019-05-16 NOTE — Anesthesia Preprocedure Evaluation (Signed)
Anesthesia Evaluation  Patient identified by MRN, date of birth, ID band Patient awake    Reviewed: Allergy & Precautions, NPO status , Patient's Chart, lab work & pertinent test results  History of Anesthesia Complications Negative for: history of anesthetic complications  Airway Mallampati: II  TM Distance: >3 FB Neck ROM: Full    Dental  (+) Dental Advisory Given   Pulmonary neg pulmonary ROS,    breath sounds clear to auscultation       Cardiovascular negative cardio ROS   Rhythm:Regular Rate:Normal     Neuro/Psych  Headaches,    GI/Hepatic negative GI ROS, Neg liver ROS,   Endo/Other  negative endocrine ROS  Renal/GU negative Renal ROS     Musculoskeletal   Abdominal   Peds  Hematology plt 232K    Anesthesia Other Findings   Reproductive/Obstetrics (+) Pregnancy                             Anesthesia Physical  Anesthesia Plan  ASA: II  Anesthesia Plan: Epidural   Post-op Pain Management:    Induction:   PONV Risk Score and Plan: Treatment may vary due to age or medical condition  Airway Management Planned: Natural Airway  Additional Equipment:   Intra-op Plan:   Post-operative Plan:   Informed Consent: I have reviewed the patients History and Physical, chart, labs and discussed the procedure including the risks, benefits and alternatives for the proposed anesthesia with the patient or authorized representative who has indicated his/her understanding and acceptance.       Plan Discussed with:   Anesthesia Plan Comments: (Patient identified. Risks/Benefits/Options discussed with patient including but not limited to bleeding, infection, nerve damage, paralysis, failed block, incomplete pain control, headache, blood pressure changes, nausea, vomiting, reactions to medication both or allergic, itching and postpartum back pain. Confirmed with bedside nurse the  patient's most recent platelet count. Confirmed with patient that they are not currently taking any anticoagulation, have any bleeding history or any family history of bleeding disorders. Patient expressed understanding and wished to proceed. All questions were answered. )        Anesthesia Quick Evaluation

## 2019-05-16 NOTE — MAU Note (Signed)
Pt here with c/o contractions. Denies any bleeding or gush of fluid. Reports good fetal movement. Was 3.5 cm on last exam.

## 2019-05-16 NOTE — H&P (Signed)
Maria Walton is a 33 year old G 2 P 1001 admitted in labor Very uncomfortable. OB History    Gravida  2   Para  1   Term  1   Preterm      AB      Living  1     SAB      TAB      Ectopic      Multiple  0   Live Births  1          Past Medical History:  Diagnosis Date  . Anemia    Only with this pregnancy; taking iron supplements  . Complication of anesthesia    N/V; rash  . Dizziness   . Endometriosis   . Headache   . Heart palpitations    Past Surgical History:  Procedure Laterality Date  . APPENDECTOMY     dr Lovell Sheehan 1999  . CHOLECYSTECTOMY  06/05/2012   Procedure: LAPAROSCOPIC CHOLECYSTECTOMY;  Surgeon: Marlane Hatcher, MD;  Location: [facility];  Service: General;  Laterality: N/A;  . PELVIC LAPAROSCOPY  2017   Endometriosis   Family History: family history includes Breast cancer (age of onset: 5) in her paternal grandmother; Diabetes in her maternal grandmother; Heart disease in her maternal grandmother; Hypertension in her father and paternal grandfather. Social History:  reports that she has never smoked. She has never used smokeless tobacco. She reports that she does not drink alcohol or use drugs.     Maternal Diabetes: No Genetic Screening: Normal Maternal Ultrasounds/Referrals: Normal Fetal Ultrasounds or other Referrals:  None Maternal Substance Abuse:  No Significant Maternal Medications:  None Significant Maternal Lab Results:  None Other Comments:  None  Review of Systems  All other systems reviewed and are negative.  Maternal Medical History:  Reason for admission: Contractions.     Dilation: 4.5 Effacement (%): 80 Station: -1 Exam by:: Pharmacologist Blood pressure 121/75, pulse 88, temperature 97.9 F (36.6 C), temperature source Oral, resp. rate 20, height 5\' 7"  (1.702 m), weight 57.6 kg, last menstrual period 08/21/2018, SpO2 99 %, not currently breastfeeding. Maternal Exam:  Uterine Assessment: Contraction  strength is moderate.  Contraction frequency is regular.      Fetal Exam Fetal State Assessment: Category I - tracings are normal.     Physical Exam  Nursing note and vitals reviewed. Constitutional: She appears well-developed and well-nourished.  HENT:  Head: Normocephalic and atraumatic.  Eyes: Pupils are equal, round, and reactive to light.  Neck: Normal range of motion.  Cardiovascular: Normal rate and regular rhythm.    Prenatal labs: ABO, Rh: --/--/O POS (05/05 5056) Antibody: NEG (05/05 0641) Rubella:   RPR: Non Reactive (05/05 0655)  HBsAg:    HIV:    GBS:     Assessment/Plan: IUP at term Labor  Anticipate NSVD  Jeani Hawking 05/16/2019, 7:04 AM

## 2019-05-16 NOTE — Anesthesia Procedure Notes (Signed)
Epidural Patient location during procedure: OB Start time: 05/16/2019 8:08 AM End time: 05/16/2019 8:22 AM  Staffing Anesthesiologist: Lowella Curb, MD Performed: anesthesiologist   Preanesthetic Checklist Completed: patient identified, site marked, surgical consent, pre-op evaluation, timeout performed, IV checked, risks and benefits discussed and monitors and equipment checked  Epidural Patient position: sitting Prep: ChloraPrep Patient monitoring: heart rate, cardiac monitor, continuous pulse ox and blood pressure Approach: midline Location: L2-L3 Injection technique: LOR saline  Needle:  Needle type: Tuohy  Needle gauge: 17 G Needle length: 9 cm Needle insertion depth: 4 cm Catheter type: closed end flexible Catheter size: 20 Guage Catheter at skin depth: 8 cm Test dose: negative  Assessment Events: blood not aspirated, injection not painful, no injection resistance, negative IV test and no paresthesia  Additional Notes Reason for block:procedure for pain

## 2019-05-17 LAB — CBC
HCT: 36.8 % (ref 36.0–46.0)
Hemoglobin: 12.1 g/dL (ref 12.0–15.0)
MCH: 29.4 pg (ref 26.0–34.0)
MCHC: 32.9 g/dL (ref 30.0–36.0)
MCV: 89.5 fL (ref 80.0–100.0)
Platelets: 234 10*3/uL (ref 150–400)
RBC: 4.11 MIL/uL (ref 3.87–5.11)
RDW: 14.3 % (ref 11.5–15.5)
WBC: 9.8 10*3/uL (ref 4.0–10.5)
nRBC: 0 % (ref 0.0–0.2)

## 2019-05-17 NOTE — Discharge Summary (Signed)
Obstetric Discharge Summary Reason for Admission: onset of labor Prenatal Procedures: ultrasound Intrapartum Procedures: spontaneous vaginal delivery Postpartum Procedures: none Complications-Operative and Postpartum: none Hemoglobin  Date Value Ref Range Status  05/17/2019 12.1 12.0 - 15.0 g/dL Final   HCT  Date Value Ref Range Status  05/17/2019 36.8 36.0 - 46.0 % Final    Physical Exam:  General: alert and appears stated age 33: appropriate Uterine Fundus: firm Incision: n/a DVT Evaluation: No evidence of DVT seen on physical exam.  Discharge Diagnoses: Term Pregnancy-delivered  Discharge Information: Date: 05/17/2019 Activity: pelvic rest Diet: routine Medications: PNV and Ibuprofen Condition: stable Instructions: refer to practice specific booklet Discharge to: home   Newborn Data: Live born female  Birth Weight: 7 lb 5 oz (3317 g) APGAR: 9, 9  Newborn Delivery   Birth date/time:  05/16/2019 11:09:00 Delivery type:  Vaginal, Spontaneous     Home with mother.  Zelphia Cairo 05/17/2019, 9:47 AM

## 2019-05-17 NOTE — Anesthesia Postprocedure Evaluation (Signed)
Anesthesia Post Note  Patient: Maria Walton  Procedure(s) Performed: AN AD HOC LABOR EPIDURAL     Patient location during evaluation: Mother Baby Anesthesia Type: Epidural Level of consciousness: awake and alert Pain management: pain level controlled Vital Signs Assessment: post-procedure vital signs reviewed and stable Respiratory status: spontaneous breathing, nonlabored ventilation and respiratory function stable Cardiovascular status: stable Postop Assessment: no headache, no backache, epidural receding, no apparent nausea or vomiting, patient able to bend at knees, able to ambulate and adequate PO intake Anesthetic complications: no    Last Vitals:  Vitals:   05/16/19 2216 05/17/19 0300  BP: 107/64 128/78  Pulse: 64 66  Resp: 18 16  Temp: 36.5 C 36.7 C  SpO2:  98%    Last Pain:  Vitals:   05/17/19 0825  TempSrc:   PainSc: 0-No pain   Pain Goal:                Epidural/Spinal Function Cutaneous sensation: Normal sensation (05/17/19 0825)  Laban Emperor

## 2019-05-18 IMAGING — US US MFM OB DETAIL +14 WK
1 series · 14 of 28 positions shown · non-contrast
Comparison: none

[Series 1: us mfm ob detail +14 wk · 79 acquisitions, 14 frames shown]
[im 3/79]
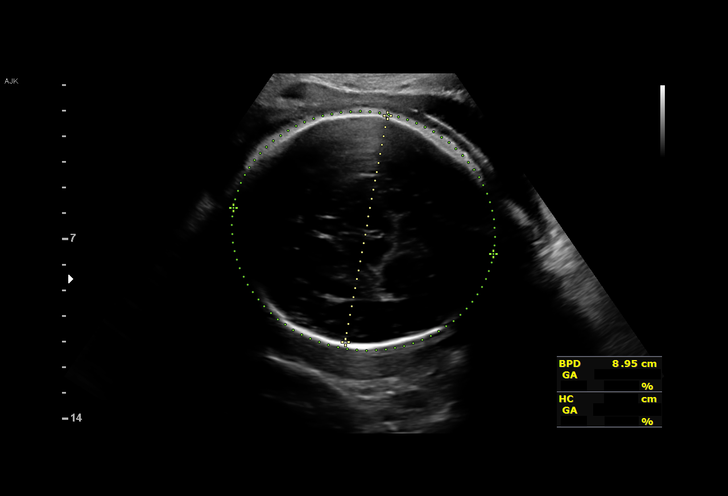
[im 9/79]
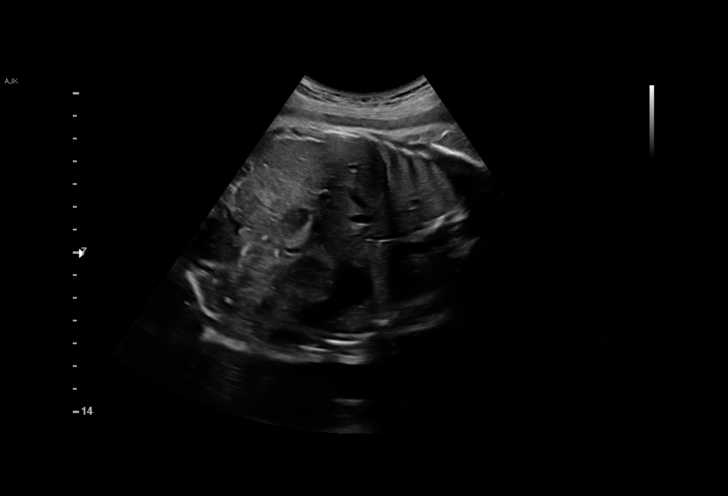
[im 15/79]
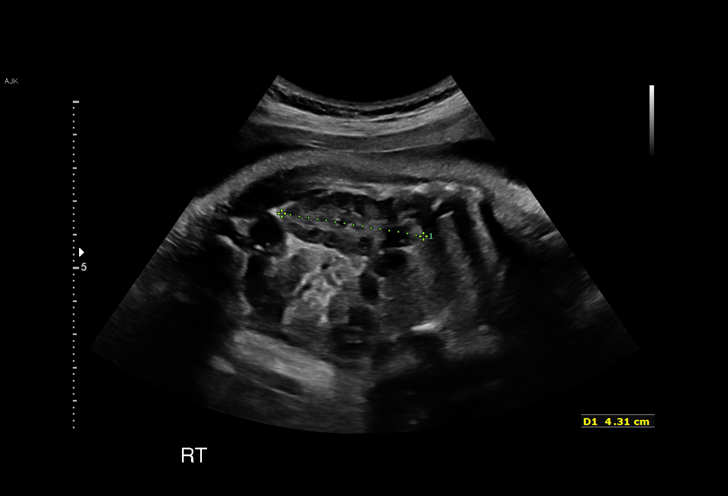
[im 21/79]
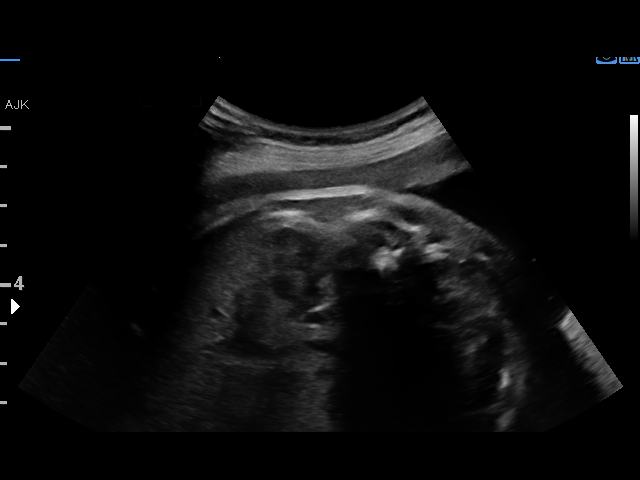
[im 27/79]
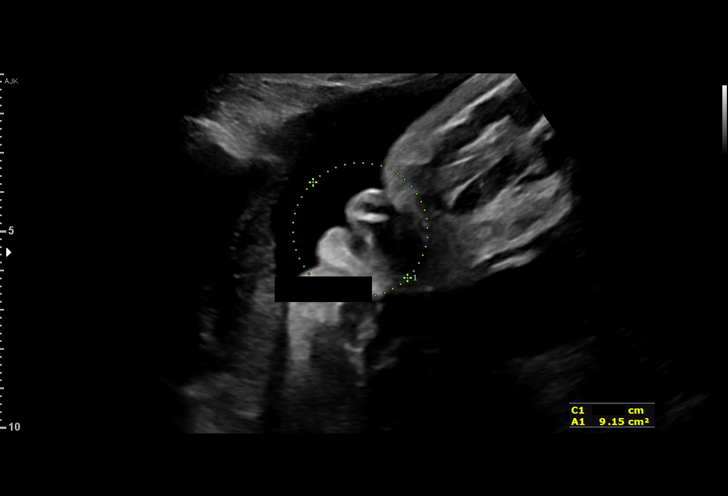
[im 32/79]
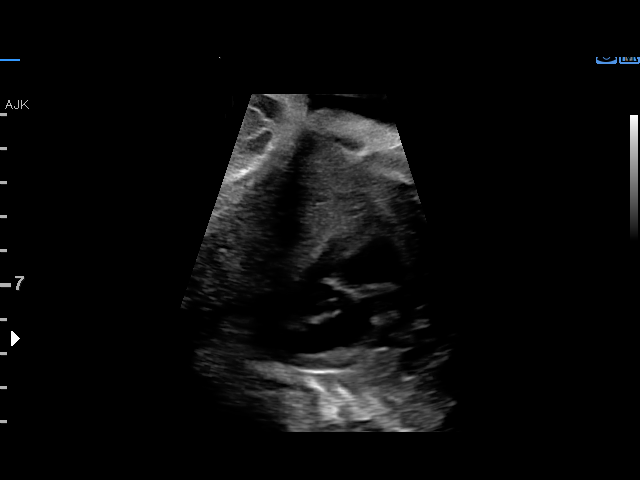
[im 38/79]
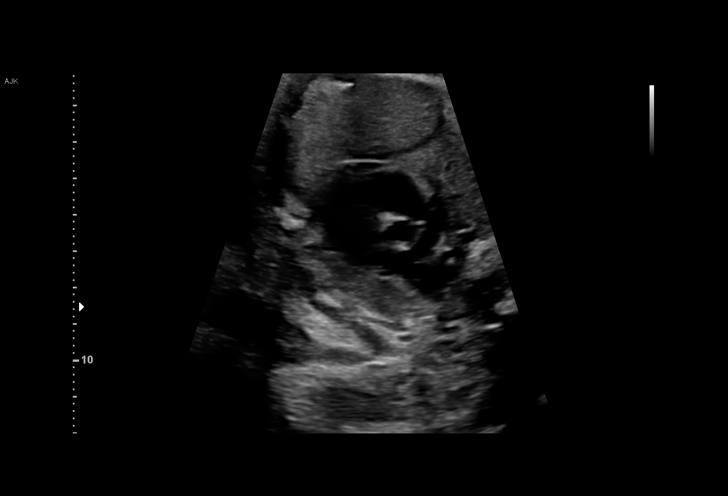
[im 44/79]
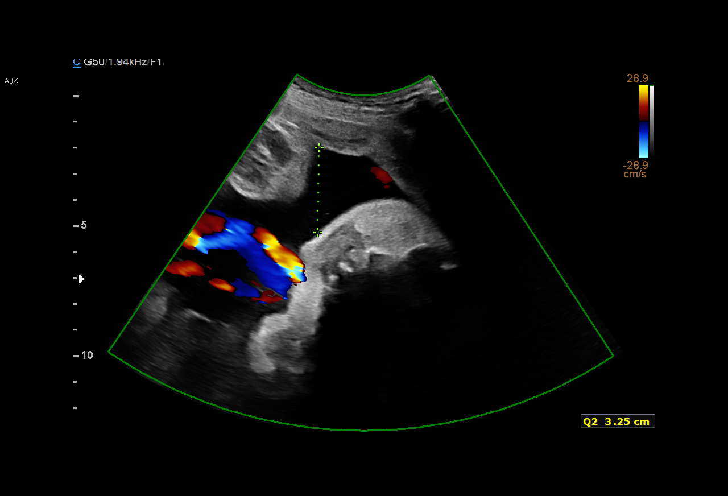
[im 50/79]
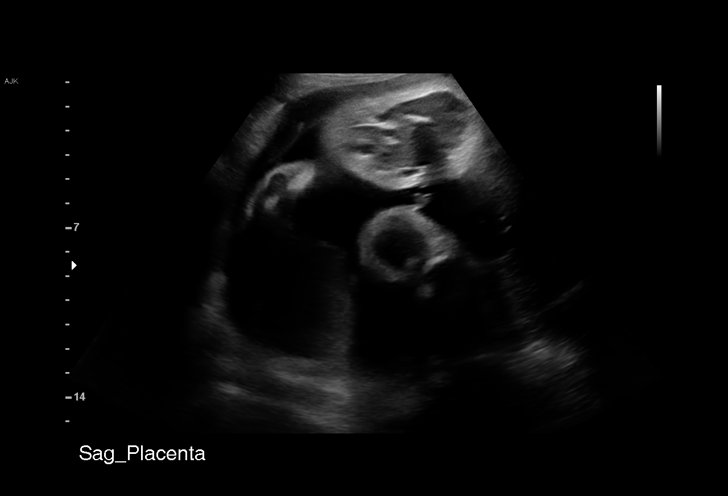
[im 55/79]
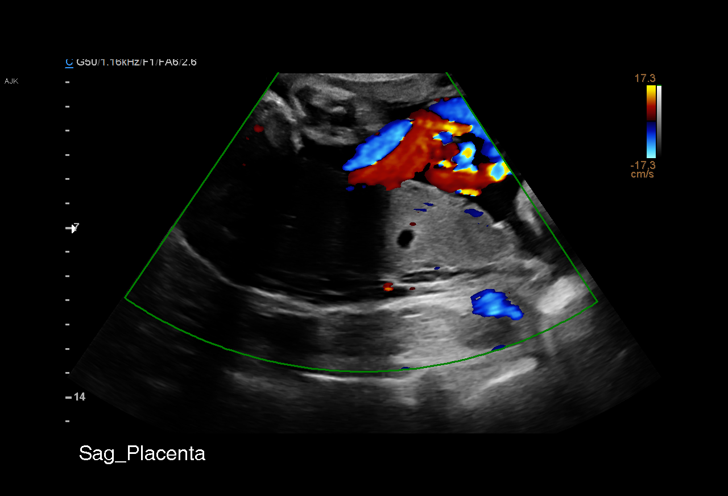
[im 61/79]
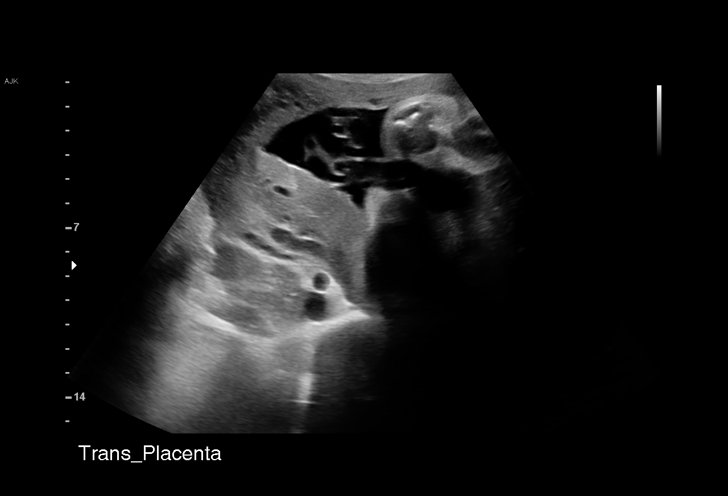
[im 67/79]
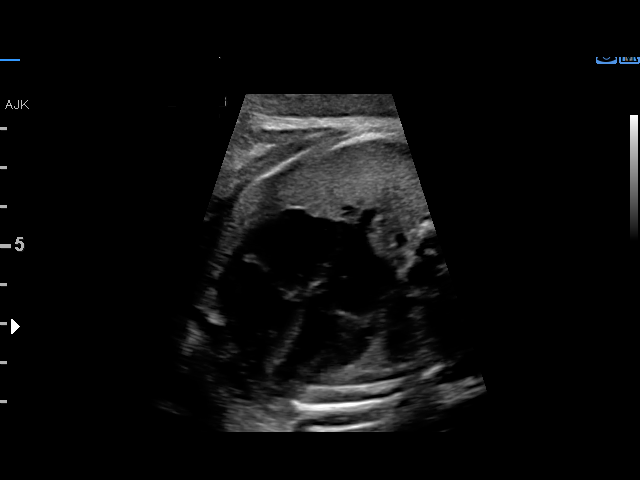
[im 73/79]
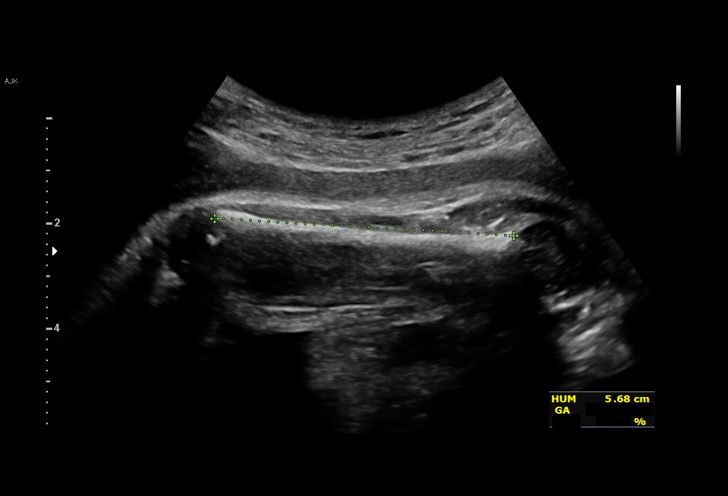
[im 79/79]
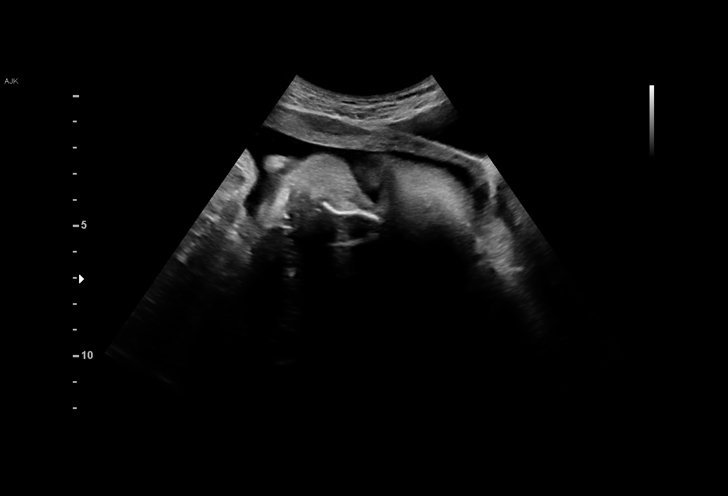

[14 of 28 positions shown; findings below may reference images not displayed]

Road [HOSPITAL]

 ----------------------------------------------------------------------

 ----------------------------------------------------------------------
Indications

  Preterm labor
  Encounter for antenatal screening for
  malformations
  34 weeks gestation of pregnancy
 ----------------------------------------------------------------------
Fetal Evaluation

 Num Of Fetuses:         1
 Fetal Heart Rate(bpm):  148
 Cardiac Activity:       Observed
 Presentation:           Cephalic
 Placenta:               Posterior
 P. Cord Insertion:      Visualized

 Amniotic Fluid
 AFI FV:      Within normal limits

 AFI Sum(cm)     %Tile       Largest Pocket(cm)
 11              27

 RUQ(cm)                     LUQ(cm)        LLQ(cm)

Biophysical Evaluation

 Amniotic F.V:   Pocket => 2 cm two         F. Tone:        Observed
                 planes
 F. Movement:    Observed                   Score:          [DATE]
 F. Breathing:   Observed
Biometry
 BPD:      89.9  mm     G. Age:  36w 3d         95  %    CI:        82.61   %    70 - 86
                                                         FL/HC:      20.8   %    19.4 -
 HC:       312   mm     G. Age:  34w 6d         34  %    HC/AC:      0.95        0.96 -
 AC:      327.8  mm     G. Age:  36w 5d       > 97  %    FL/BPD:     72.3   %    71 - 87
 FL:         65  mm     G. Age:  33w 4d         24  %    FL/AC:      19.8   %    20 - 24
 HUM:      57.3  mm     G. Age:  33w 2d         41  %

 LV:        3.5  mm

 Est. FW:    6923  gm      6 lb 1 oz     83  %
OB History

 Gravidity:    2         Term:   1
Gestational Age

 LMP:           34w 1d        Date:  08/21/18                 EDD:   05/28/19
 U/S Today:     35w 3d                                        EDD:   05/19/19
 Best:          34w 1d     Det. By:  LMP  (08/21/18)          EDD:   05/28/19
Anatomy

 Cranium:               Appears normal         Aortic Arch:            Not well visualized
 Cavum:                 Appears normal         Ductal Arch:            Not well visualized
 Ventricles:            Appears normal         Diaphragm:              Appears normal
 Choroid Plexus:        Appears normal         Stomach:                Appears normal, left
                                                                       sided
 Cerebellum:            Not well visualized    Abdomen:                Appears normal
 Posterior Fossa:       Not well visualized    Abdominal Wall:         Not well visualized
 Nuchal Fold:           Not applicable (>20    Cord Vessels:           Appears normal (3
                        wks GA)                                        vessel cord)
 Face:                  Orbits nl; profile not Kidneys:                Appear normal
                        well visualized
 Lips:                  Not well visualized    Bladder:                Appears normal
 Thoracic:              Appears normal         Spine:                  Appears normal
 Heart:                 Appears normal         Upper Extremities:      Not well visualized
                        (4CH, axis, and
                        situs)
 RVOT:                  Appears normal         Lower Extremities:      Appears normal
 LVOT:                  Appears normal

 Other:  Fetus appears to be female. Technically difficult due to advanced GA
         and fetal position.
Impression

 Patient is admitted with the diagnosis of preterm labor.
 Fetal growth is appropriate for gestational age. Amniotic fluid
 is normal and good fetal activity is seen. Fetal anatomy
 appears normal, but limited by advanced gestational age.
 Incidentally observed antenatal testing is reassuring.

                 Christen, Dimitris

## 2019-06-21 DIAGNOSIS — Z3482 Encounter for supervision of other normal pregnancy, second trimester: Secondary | ICD-10-CM | POA: Diagnosis not present

## 2019-06-21 DIAGNOSIS — Z3483 Encounter for supervision of other normal pregnancy, third trimester: Secondary | ICD-10-CM | POA: Diagnosis not present

## 2019-06-26 DIAGNOSIS — Z309 Encounter for contraceptive management, unspecified: Secondary | ICD-10-CM | POA: Diagnosis not present

## 2019-06-26 DIAGNOSIS — G43909 Migraine, unspecified, not intractable, without status migrainosus: Secondary | ICD-10-CM | POA: Diagnosis not present

## 2019-06-26 DIAGNOSIS — Z1389 Encounter for screening for other disorder: Secondary | ICD-10-CM | POA: Diagnosis not present

## 2019-07-04 DIAGNOSIS — Z30430 Encounter for insertion of intrauterine contraceptive device: Secondary | ICD-10-CM | POA: Diagnosis not present

## 2019-07-04 DIAGNOSIS — Z3202 Encounter for pregnancy test, result negative: Secondary | ICD-10-CM | POA: Diagnosis not present

## 2019-08-08 DIAGNOSIS — Z3483 Encounter for supervision of other normal pregnancy, third trimester: Secondary | ICD-10-CM | POA: Diagnosis not present

## 2019-08-08 DIAGNOSIS — Z3482 Encounter for supervision of other normal pregnancy, second trimester: Secondary | ICD-10-CM | POA: Diagnosis not present

## 2019-08-15 DIAGNOSIS — Z30431 Encounter for routine checking of intrauterine contraceptive device: Secondary | ICD-10-CM | POA: Diagnosis not present

## 2019-09-03 ENCOUNTER — Encounter: Payer: Self-pay | Admitting: Gynecology

## 2019-09-25 DIAGNOSIS — J019 Acute sinusitis, unspecified: Secondary | ICD-10-CM | POA: Diagnosis not present

## 2019-09-25 DIAGNOSIS — R42 Dizziness and giddiness: Secondary | ICD-10-CM | POA: Diagnosis not present

## 2019-09-25 DIAGNOSIS — R002 Palpitations: Secondary | ICD-10-CM | POA: Diagnosis not present

## 2019-09-25 DIAGNOSIS — G43109 Migraine with aura, not intractable, without status migrainosus: Secondary | ICD-10-CM | POA: Diagnosis not present

## 2019-09-25 DIAGNOSIS — G43909 Migraine, unspecified, not intractable, without status migrainosus: Secondary | ICD-10-CM | POA: Diagnosis not present

## 2019-11-13 ENCOUNTER — Telehealth: Payer: Self-pay | Admitting: Cardiovascular Disease

## 2019-11-13 ENCOUNTER — Other Ambulatory Visit: Payer: Self-pay

## 2019-11-13 ENCOUNTER — Ambulatory Visit: Payer: 59 | Admitting: Cardiovascular Disease

## 2019-11-13 ENCOUNTER — Encounter: Payer: Self-pay | Admitting: Cardiovascular Disease

## 2019-11-13 VITALS — BP 86/58 | HR 67 | Ht 67.0 in | Wt 111.0 lb

## 2019-11-13 DIAGNOSIS — R55 Syncope and collapse: Secondary | ICD-10-CM | POA: Diagnosis not present

## 2019-11-13 DIAGNOSIS — I9589 Other hypotension: Secondary | ICD-10-CM | POA: Diagnosis not present

## 2019-11-13 DIAGNOSIS — G909 Disorder of the autonomic nervous system, unspecified: Secondary | ICD-10-CM | POA: Diagnosis not present

## 2019-11-13 DIAGNOSIS — R002 Palpitations: Secondary | ICD-10-CM

## 2019-11-13 NOTE — Addendum Note (Signed)
Addended by: Laurine Blazer on: 11/13/2019 03:07 PM   Modules accepted: Orders

## 2019-11-13 NOTE — Progress Notes (Signed)
CARDIOLOGY CONSULT NOTE  Patient ID: SHADIYAH WERNLI MRN: [id number] DOB/AGE: 08-30-1986 33 y.o.  Admit date: (Not on file) Primary Physician: Celene Squibb, MD  Reason for Consultation: Palpitations  HPI: Maria Walton is a 33 y.o. female who is being seen today for the evaluation of palpitations at the request of Celene Squibb, MD.   She was evaluated by neurology in March 2019 for dizziness.  It was felt she met criteria for orthostatic hypotension.  I last evaluated her in August 2016.  She has a history of autonomic dysfunction.  Prior event monitoring demonstrated sinus rhythm and sinus tachycardia with PACs and a 5 beat run of atrial tachycardia. Symptoms correlated with both sinus rhythm, sinus tachycardia, and PACs.  She subsequently saw Dr. Lovena Le (electrophysiologist) who recommended conservative measures such as eating frequent meals, remaining adequately hydrated, increased sodium consumption, and avoiding alcohol. He said that if these measures did not work, he would then consider centrally acting agents such as Florinef.  ECG performed in the office today which I ordered and personally interpreted demonstrates normal sinus rhythm with no ischemic ST segment or T-wave abnormalities, nor any arrhythmias.  Since her last visit with me she has given birth to 2 children.  She has a 1-year-old daughter and a 83-monthold son.  In the past 2 to 3 months she has had increasing palpitations occurring at rest.  She has had episodes of near syncope associated with this.  She denies chest pain, shortness of breath, leg swelling, orthopnea and paroxysmal nocturnal dyspnea.  She recently had labs done by her PCP and was told they were normal.  She drinks 1 cup of coffee every morning.  She tries to drink a lot of water.  Her blood pressure generally runs in the 90/60 range.  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   Allergies  Allergen Reactions  . Ceclor [Cefaclor] Hives    Childhood allergy  . Promethazine Hcl Other (See Comments)    hyperactivity  . Latex Rash    Rash when using latex gloves    Current Outpatient Medications  Medication Sig Dispense Refill  . acetaminophen (TYLENOL) 325 MG tablet Take 650 mg by mouth every 6 (six) hours as needed.    . Cyanocobalamin (VITAMIN B-12) 1000 MCG SUBL Place 1 tablet under the tongue at bedtime.    . Multiple Vitamins-Minerals (ADULT ONE DAILY GUMMIES) CHEW Chew 2 tablets by mouth daily.     No current facility-administered medications for this visit.     Past Medical History:  Diagnosis Date  . Anemia    Only with this pregnancy; taking iron supplements  . Complication of anesthesia    N/V; rash  . Dizziness   . Endometriosis   . Headache   . Heart palpitations     Past Surgical History:  Procedure Laterality Date  . APPENDECTOMY     dr jArnoldo Morale1999  . CHOLECYSTECTOMY  06/05/2012   Procedure: LAPAROSCOPIC CHOLECYSTECTOMY;  Surgeon: WScherry Ran MD;  Location: [facility];  Service: General;  Laterality: N/A;  . PELVIC LAPAROSCOPY  2017   Endometriosis    Social History   Socioeconomic History  . Marital status: Married    Spouse name: BRuby Cola . Number of children: Not on file  . Years of education: Not on file  . Highest education level: Not on file  Occupational History  .  Occupation: Therapist, sports   Social Needs  . Financial resource strain: Not on file  . Food insecurity    Worry: Not on file    Inability: Not on file  . Transportation needs    Medical: Not on file    Non-medical: Not on file  Tobacco Use  . Smoking status: Never Smoker  . Smokeless tobacco: Never Used  Substance and Sexual Activity  . Alcohol use: No    Alcohol/week: 0.0 standard drinks  . Drug use: No  . Sexual activity: Yes    Birth control/protection: None    Comment: intercourse age 89, sexual partners less than 5  Lifestyle  . Physical activity     Days per week: Not on file    Minutes per session: Not on file  . Stress: Not on file  Relationships  . Social Herbalist on phone: Not on file    Gets together: Not on file    Attends religious service: Not on file    Active member of club or organization: Not on file    Attends meetings of clubs or organizations: Not on file    Relationship status: Not on file  . Intimate partner violence    Fear of current or ex partner: Not on file    Emotionally abused: Not on file    Physically abused: Not on file    Forced sexual activity: Not on file  Other Topics Concern  . Not on file  Social History Narrative  . Not on file     No family history of premature CAD in 1st degree relatives.  Current Meds  Medication Sig  . acetaminophen (TYLENOL) 325 MG tablet Take 650 mg by mouth every 6 (six) hours as needed.  . Cyanocobalamin (VITAMIN B-12) 1000 MCG SUBL Place 1 tablet under the tongue at bedtime.  . Multiple Vitamins-Minerals (ADULT ONE DAILY GUMMIES) CHEW Chew 2 tablets by mouth daily.  . [DISCONTINUED] calcium carbonate (TUMS - DOSED IN MG ELEMENTAL CALCIUM) 500 MG chewable tablet Chew 1 tablet by mouth at bedtime.  . [DISCONTINUED] famotidine (PEPCID) 20 MG tablet Take 20 mg by mouth 2 (two) times daily.      Review of systems complete and found to be negative unless listed above in HPI    Physical exam Blood pressure (!) 86/58, pulse 67, height _0  (1.702 m), weight 111 lb (50.3 kg), SpO2 96 %, unknown if currently breastfeeding. General: NAD Neck: No JVD, no thyromegaly or thyroid nodule.  Lungs: Clear to auscultation bilaterally with normal respiratory effort. CV: Nondisplaced PMI. Regular rate and rhythm, normal S1/S2, no S3/S4, no murmur.  No peripheral edema.  No carotid bruit.    Abdomen: Soft, nontender, no distention.  Skin: Intact without lesions or rashes.  Neurologic: Alert and oriented x 3.  Psych: Normal affect. Extremities: No clubbing or  cyanosis.  HEENT: Normal.   ECG: Most recent ECG reviewed.   Labs: Lab Results  Component Value Date/Time   K 3.4 (L) 12/12/2018 05:22 PM   BUN 8 12/12/2018 05:22 PM   CREATININE 0.45 12/12/2018 05:22 PM   CREATININE 0.56 03/12/2016 03:45 PM   ALT 12 12/12/2018 05:22 PM   TSH 1.705 12/30/2014 12:41 PM   HGB 12.1 05/17/2019 05:58 AM     Lipids: Lab Results  Component Value Date/Time   LDLCALC 84 03/12/2016 03:45 PM   CHOL 131 03/12/2016 03:45 PM   TRIG 41 03/12/2016 03:45 PM   HDL 39 (L)  03/12/2016 03:45 PM        ASSESSMENT AND PLAN:  1.  Autonomic dysfunction/palpitations/near syncope/hypotension: I previously prescribed compression stockings.  She wears them occasionally.  I recommended she wear them during the daytime and take them off at night as they did help to improve her symptoms.  I will obtain a 2-week event monitor to evaluate for any potential arrhythmias.  I have asked her to purchase a blood pressure cuff so that she can check her blood pressure and heart rate during episodes of near syncope.  If she continues to experience palpitations with symptomatic hypotension, I will start fludrocortisone.     Disposition: Follow up in 8-10 weeks (virtual)  Signed: Kate Sable, M.D., F.A.C.C.  11/13/2019, 2:42 PM

## 2019-11-13 NOTE — Patient Instructions (Signed)
Medication Instructions:  Continue all current medications.  Labwork: none  Testing/Procedures:  Your physician has recommended that you wear a 2 week event monitor. Event monitors are medical devices that record the heart's electrical activity. Doctors most often [place] these monitors to diagnose arrhythmias. Arrhythmias are problems with the speed or rhythm of the heartbeat. The monitor is a small, portable device. You can wear one while you do your normal daily activities. This is usually used to diagnose what is causing palpitations/syncope (passing out).  Office will contact with results via phone or letter.    Follow-Up: 8-10 weeks   Any Other Special Instructions Will Be Listed Below (If Applicable). Monitor blood pressure during episodes over the next 6 weeks & notify the office.   If you need a refill on your cardiac medications before your next appointment, please call your pharmacy.

## 2019-11-13 NOTE — Telephone Encounter (Signed)
Pre-cert Verification for the following procedure   ° °Cardiac event monitor °

## 2019-11-13 NOTE — Telephone Encounter (Signed)
Virtual Visit Pre-Appointment Phone Call  "(Name), I am calling you today to discuss your upcoming appointment. We are currently trying to limit exposure to the virus that causes COVID-19 by seeing patients at home rather than in the office."  "What is the BEST phone number to call the day of the visit?" - [phone]  1. Do you have or have access to (through a family member/friend) a smartphone with video capability that we can use for your visit?" a. If yes - list this number in appt notes as cell (if different from BEST phone #) and list the appointment type as a VIDEO visit in appointment notes b. If no - list the appointment type as a PHONE visit in appointment notes  2. Confirm consent - "In the setting of the current Covid19 crisis, you are scheduled for a (phone or video) visit with your provider on (date) at (time).  Just as we do with many in-office visits, in order for you to participate in this visit, we must obtain consent.  If you'd like, I can send this to your mychart (if signed up) or email for you to review.  Otherwise, I can obtain your verbal consent now.  All virtual visits are billed to your insurance company just like a normal visit would be.  By agreeing to a virtual visit, we'd like you to understand that the technology does not allow for your provider to perform an examination, and thus may limit your provider's ability to fully assess your condition. If your provider identifies any concerns that need to be evaluated in person, we will make arrangements to do so.  Finally, though the technology is pretty good, we cannot assure that it will always work on either your or our end, and in the setting of a video visit, we may have to convert it to a phone-only visit.  In either situation, we cannot ensure that we have a secure connection.  Are you willing to proceed?" STAFF: Did the patient verbally acknowledge consent to telehealth visit? Document YES/NO here: YES    3. Advise patient to be prepared - "Two hours prior to your appointment, go ahead and check your blood pressure, pulse, oxygen saturation, and your weight (if you have the equipment to check those) and write them all down. When your visit starts, your provider will ask you for this information. If you have an Apple Watch or Kardia device, please plan to have heart rate information ready on the day of your appointment. Please have a pen and paper handy nearby the day of the visit as well."  4. Give patient instructions for MyChart download to smartphone OR Doximity/Doxy.me as below if video visit (depending on what platform provider is using)  5. Inform patient they will receive a phone call 15 minutes prior to their appointment time (may be from unknown caller ID) so they should be prepared to answer    TELEPHONE CALL NOTE  Maria Walton has been deemed a candidate for a follow-up tele-health visit to limit community exposure during the Covid-19 pandemic. I spoke with the patient via phone to ensure availability of phone/video source, confirm preferred email & phone number, and discuss instructions and expectations.  I reminded Maria Walton to be prepared with any vital sign and/or heart rhythm information that could potentially be obtained via home monitoring, at the time of her visit. I reminded Maria Walton to expect a phone call prior to her visit.  Vicky  T Slaughter 11/13/2019 3:16 PM   INSTRUCTIONS FOR DOWNLOADING THE MYCHART APP TO SMARTPHONE  - The patient must first make sure to have activated MyChart and know their login information - If Apple, go to [employer] and type in MyChart in the search bar and download the app. If Android, ask patient to go to [employer] and type in [place] in the search bar and download the app. The app is free but as with any other app downloads, their phone may require them to verify saved payment information or Apple/Android password.  -  The patient will need to then log into the app with their MyChart username and password, and select [facility] as their healthcare provider to link the account. When it is time for your visit, go to the MyChart app, find appointments, and click Begin Video Visit. Be sure to Select Allow for your device to access the Microphone and Camera for your visit. You will then be connected, and your provider will be with you shortly.  **If they have any issues connecting, or need assistance please contact MyChart service desk [phone])**  **If using a computer, in order to ensure the best quality for their visit they will need to use either of the following Internet Browsers: [employer] or Google Chrome**  IF USING DOXIMITY or DOXY.ME - The patient will receive a link just prior to their visit by text.     FULL LENGTH CONSENT FOR TELE-HEALTH VISIT   I hereby voluntarily request, consent and authorize [facility] and its employed or contracted physicians, physician assistants, nurse practitioners or other licensed health care professionals (the Practitioner), to provide me with telemedicine health care services (the Services") as deemed necessary by the treating Practitioner. I acknowledge and consent to receive the Services by the Practitioner via telemedicine. I understand that the telemedicine visit will involve communicating with the Practitioner through live audiovisual communication technology and the disclosure of certain medical information by electronic transmission. I acknowledge that I have been given the opportunity to request an in-person assessment or other available alternative prior to the telemedicine visit and am voluntarily participating in the telemedicine visit.  I understand that I have the right to withhold or withdraw my consent to the use of telemedicine in the course of my care at any time, without affecting my right to future care or treatment, and that the  Practitioner or I may terminate the telemedicine visit at any time. I understand that I have the right to inspect all information obtained and/or recorded in the course of the telemedicine visit and may receive copies of available information for a reasonable fee.  I understand that some of the potential risks of receiving the Services via telemedicine include:   Delay or interruption in medical evaluation due to technological equipment failure or disruption;  Information transmitted may not be sufficient (e.g. poor resolution of images) to allow for appropriate medical decision making by the Practitioner; and/or   In rare instances, security protocols could fail, causing a breach of personal health information.  Furthermore, I acknowledge that it is my responsibility to provide information about my medical history, conditions and care that is complete and accurate to the best of my ability. I acknowledge that Practitioner's advice, recommendations, and/or decision may be based on factors not within their control, such as incomplete or inaccurate data provided by me or distortions of diagnostic images or specimens that may result from electronic transmissions. I understand that the practice  of medicine is not an Chief Strategy Officer and that Practitioner makes no warranties or guarantees regarding treatment outcomes. I acknowledge that I will receive a copy of this consent concurrently upon execution via email to the email address I last provided but may also request a printed copy by calling the office of [facility].    I understand that my insurance will be billed for this visit.   I have read or had this consent read to me.  I understand the contents of this consent, which adequately explains the benefits and risks of the Services being provided via telemedicine.   I have been provided ample opportunity to ask questions regarding this consent and the Services and have had my questions answered to my  satisfaction.  I give my informed consent for the services to be provided through the use of telemedicine in my medical care  By participating in this telemedicine visit I agree to the above.

## 2019-11-15 ENCOUNTER — Encounter: Payer: Self-pay | Admitting: *Deleted

## 2019-11-22 ENCOUNTER — Ambulatory Visit (INDEPENDENT_AMBULATORY_CARE_PROVIDER_SITE_OTHER): Payer: 59

## 2019-11-22 DIAGNOSIS — R002 Palpitations: Secondary | ICD-10-CM | POA: Diagnosis not present

## 2020-01-03 ENCOUNTER — Telehealth: Payer: Self-pay | Admitting: *Deleted

## 2020-01-03 NOTE — Telephone Encounter (Signed)
Lesle Chris, [place]  4/81/8590 93:11 AM EST    Patient notified. Copy to pmd. Follow up scheduled for 01/09/2020 with Dr. Purvis Sheffield.

## 2020-01-03 NOTE — Telephone Encounter (Signed)
-----   Message from Nori Riis, RN sent at 01/03/2020  7:36 AM EST -----  ----- Message ----- From: Laqueta Linden, MD Sent: 01/02/2020   9:12 AM EST To: Nori Riis, RN      Predominantly sinus rhythm. Occasional sinus tachycardia. Occasional PAC's and PVC'. No significant arrhythmias.  Patient triggered events corresponded with all of the above including sinus rhythm.

## 2020-01-09 ENCOUNTER — Encounter: Payer: Self-pay | Admitting: Cardiovascular Disease

## 2020-01-09 ENCOUNTER — Telehealth (INDEPENDENT_AMBULATORY_CARE_PROVIDER_SITE_OTHER): Payer: 59 | Admitting: Cardiovascular Disease

## 2020-01-09 VITALS — BP 90/68 | HR 84 | Ht 67.0 in | Wt 106.0 lb

## 2020-01-09 DIAGNOSIS — R55 Syncope and collapse: Secondary | ICD-10-CM

## 2020-01-09 DIAGNOSIS — I9589 Other hypotension: Secondary | ICD-10-CM

## 2020-01-09 DIAGNOSIS — G909 Disorder of the autonomic nervous system, unspecified: Secondary | ICD-10-CM | POA: Diagnosis not present

## 2020-01-09 DIAGNOSIS — R002 Palpitations: Secondary | ICD-10-CM

## 2020-01-09 NOTE — Patient Instructions (Addendum)
Medication Instructions:   Your physician recommends that you continue on your current medications as directed. Please refer to the Current Medication list given to you today.  Labwork:  NONE  Testing/Procedures:  NONE  Follow-Up:  Your physician recommends that you schedule a follow-up appointment in: as needed.   Any Other Special Instructions Will Be Listed Below (If Applicable).  If you need a refill on your cardiac medications before your next appointment, please call your pharmacy. 

## 2020-01-09 NOTE — Progress Notes (Signed)
Virtual Visit via Telephone Note   This visit type was conducted due to national recommendations for restrictions regarding the COVID-19 Pandemic (e.g. social distancing) in an effort to limit this patient's exposure and mitigate transmission in our community.  Due to her co-morbid illnesses, this patient is at least at moderate risk for complications without adequate follow up.  This format is felt to be most appropriate for this patient at this time.  The patient did not have access to video technology/had technical difficulties with video requiring transitioning to audio format only (telephone).  All issues noted in this document were discussed and addressed.  No physical exam could be performed with this format.  Please refer to the patient's chart for her  consent to telehealth for [facility].   Date:  01/09/2020   ID:  Maria Walton, Maria Walton December 21, 1985, MRN [id number]  Patient Location: Home Provider Location: Office  PCP:  Celene Squibb, MD  Cardiologist:  Kate Sable, MD  Electrophysiologist:  None   Evaluation Performed:  Follow-Up Visit  Chief Complaint: Palpitations  History of Present Illness:    Maria Walton is a 34 y.o. female with palpitations.  Event monitoring on 01/03/2020 demonstrated predominantly sinus rhythm with occasional sinus tachycardia, PACs, and PVCs.  There were no significant arrhythmias.  She has been having less frequent symptoms.  She has worn compression stockings more regularly.  She has been trying to push fluids.  Her worst day seem to be when she is working as she is not able to hydrate as much.  Systolic blood pressures have generally been running in the 90 to low 100 range.  She had questions about taking the COVID-19 vaccine.  She would prefer to avoid medications at this time and continue conservative measures.   Past Medical History:  Diagnosis Date  . Anemia    Only with this pregnancy; taking iron supplements  . Complication  of anesthesia    N/V; rash  . Dizziness   . Endometriosis   . Headache   . Heart palpitations    Past Surgical History:  Procedure Laterality Date  . APPENDECTOMY     dr Arnoldo Morale 1999  . CHOLECYSTECTOMY  06/05/2012   Procedure: LAPAROSCOPIC CHOLECYSTECTOMY;  Surgeon: Scherry Ran, MD;  Location: [facility];  Service: General;  Laterality: N/A;  . PELVIC LAPAROSCOPY  2017   Endometriosis     Current Meds  Medication Sig  . acetaminophen (TYLENOL) 325 MG tablet Take 650 mg by mouth every 6 (six) hours as needed.  . Cyanocobalamin (VITAMIN B-12) 1000 MCG SUBL Place 1 tablet under the tongue at bedtime.  . Multiple Vitamins-Minerals (ADULT ONE DAILY GUMMIES) CHEW Chew 2 tablets by mouth daily.     Allergies:   Ceclor [cefaclor], Promethazine hcl, and Latex   Social History   Tobacco Use  . Smoking status: Never Smoker  . Smokeless tobacco: Never Used  Substance Use Topics  . Alcohol use: No    Alcohol/week: 0.0 standard drinks  . Drug use: No     Family Hx: The patient's family history includes Breast cancer (age of onset: 13) in her paternal grandmother; Diabetes in her maternal grandmother; Heart disease in her maternal grandmother; Hypertension in her father and paternal grandfather.  ROS:   Please see the history of present illness.     All other systems reviewed and are negative.   Prior CV studies:   The following studies were reviewed today:  Reviewed above  Labs/Other  Tests and Data Reviewed:    EKG:  No ECG reviewed.  Recent Labs: 05/17/2019: Hemoglobin 12.1; Platelets 234   Recent Lipid Panel Lab Results  Component Value Date/Time   CHOL 131 03/12/2016 03:45 PM   TRIG 41 03/12/2016 03:45 PM   HDL 39 (L) 03/12/2016 03:45 PM   CHOLHDL 3.4 03/12/2016 03:45 PM   LDLCALC 84 03/12/2016 03:45 PM    Wt Readings from Last 3 Encounters:  01/09/20 106 lb (48.1 kg)  11/13/19 111 lb (50.3 kg)  05/16/19 127 lb (57.6 kg)     Objective:    Vital Signs:   BP 90/68   Pulse 84   Ht 5\' 7"  (1.702 m)   Wt 106 lb (48.1 kg)   BMI 16.60 kg/m    VITAL SIGNS:  reviewed  ASSESSMENT & PLAN:    1. Autonomic dysfunction/palpitations/near syncope/hypotension: Symptoms have been less frequent since I initially evaluated her.  She is wearing compression stockings more regularly and has been pushing fluids.  Event monitoring reviewed above which demonstrated sinus rhythm and occasional sinus tachycardia, PACs, and PVCs.  There were no significant arrhythmias.  She would prefer to avoid medications at this time so I will hold off on initiating Florinef.     COVID-19 Education: The signs and symptoms of COVID-19 were discussed with the patient and how to seek care for testing (follow up with PCP or arrange E-visit).  The importance of social distancing was discussed today.  Time:   Today, I have spent 10 minutes with the patient with telehealth technology discussing the above problems.     Medication Adjustments/Labs and Tests Ordered: Current medicines are reviewed at length with the patient today.  Concerns regarding medicines are outlined above.   Tests Ordered: No orders of the defined types were placed in this encounter.   Medication Changes: No orders of the defined types were placed in this encounter.   Follow Up:  Virtual Visit  prn  Signed, , MD  01/09/2020 8:24 AM    [facility]

## 2020-06-09 DIAGNOSIS — Z681 Body mass index (BMI) 19 or less, adult: Secondary | ICD-10-CM | POA: Diagnosis not present

## 2020-06-09 DIAGNOSIS — Z309 Encounter for contraceptive management, unspecified: Secondary | ICD-10-CM | POA: Diagnosis not present

## 2020-06-09 DIAGNOSIS — R110 Nausea: Secondary | ICD-10-CM | POA: Diagnosis not present

## 2020-06-09 DIAGNOSIS — Z01419 Encounter for gynecological examination (general) (routine) without abnormal findings: Secondary | ICD-10-CM | POA: Diagnosis not present

## 2020-08-11 ENCOUNTER — Ambulatory Visit: Payer: Self-pay | Admitting: *Deleted

## 2020-08-11 ENCOUNTER — Other Ambulatory Visit: Payer: Self-pay

## 2020-08-11 DIAGNOSIS — Z20822 Contact with and (suspected) exposure to covid-19: Secondary | ICD-10-CM

## 2020-08-11 LAB — POC COVID19 BINAXNOW: SARS Coronavirus 2 Ag: NEGATIVE

## 2020-08-19 ENCOUNTER — Other Ambulatory Visit: Payer: Self-pay

## 2020-10-30 ENCOUNTER — Ambulatory Visit
Admission: RE | Admit: 2020-10-30 | Discharge: 2020-10-30 | Disposition: A | Discharge status: Home / Self Care | Payer: 59 | Source: Ambulatory Visit | Attending: Emergency Medicine | Admitting: Emergency Medicine

## 2020-10-30 ENCOUNTER — Other Ambulatory Visit: Payer: Self-pay

## 2020-10-30 VITALS — BP 106/73 | HR 97 | Temp 97.7°F | Resp 18

## 2020-10-30 DIAGNOSIS — N898 Other specified noninflammatory disorders of vagina: Secondary | ICD-10-CM | POA: Insufficient documentation

## 2020-10-30 LAB — POCT URINALYSIS DIP (MANUAL ENTRY)
Bilirubin, UA: NEGATIVE
Glucose, UA: NEGATIVE mg/dL
Ketones, POC UA: NEGATIVE mg/dL
Nitrite, UA: NEGATIVE
Protein Ur, POC: NEGATIVE mg/dL
Spec Grav, UA: 1.025 (ref 1.010–1.025)
Urobilinogen, UA: 0.2 E.U./dL
pH, UA: 7 (ref 5.0–8.0)

## 2020-10-30 MED ORDER — FLUCONAZOLE 200 MG PO TABS: ORAL_TABLET | ORAL | 0 refills | Status: DC

## 2020-10-30 MED ORDER — METRONIDAZOLE 500 MG PO TABS: 500.0000 mg | ORAL_TABLET | Freq: Two times a day (BID) | ORAL | 0 refills | Status: DC

## 2020-10-30 NOTE — ED Provider Notes (Signed)
[facility]   [id number] 10/30/20 Arrival Time: 1457   CC: VAGINAL complaint  SUBJECTIVE:  Maria Walton is a 34 y.o. female who presents with complaints of vaginal irritation, vaginal itching, fishy vaginal odor, and thick discharge x > 1 weeks.  She denies a precipitating event, recent sexual encounter or recent antibiotic use.  Denies concern for STD.  She has tried OTC medications with temporary relief.  Denies aggravating factors.  She reports similar symptoms in the past and was diagnosed with yeast infection.  Reports dysuria as well.  She denies fever, chills, nausea, vomiting, abdominal or pelvic pain, vaginal bleeding, dyspareunia, vaginal rashes or lesions.   Patient's last menstrual period was 10/21/2020. Current birth control method: IUD   ROS: As per HPI.  All other pertinent ROS negative.     Past Medical History:  Diagnosis Date  . Anemia    Only with this pregnancy; taking iron supplements  . Complication of anesthesia    N/V; rash  . Dizziness   . Endometriosis   . Headache   . Heart palpitations    Past Surgical History:  Procedure Laterality Date  . APPENDECTOMY     dr Lovell Sheehan 1999  . CHOLECYSTECTOMY  06/05/2012   Procedure: LAPAROSCOPIC CHOLECYSTECTOMY;  Surgeon: Marlane Hatcher, MD;  Location: [facility];  Service: General;  Laterality: N/A;  . PELVIC LAPAROSCOPY  2017   Endometriosis   Allergies  Allergen Reactions  . Ceclor [Cefaclor] Hives    Childhood allergy  . Promethazine Hcl Other (See Comments)    hyperactivity  . Latex Rash    Rash when using latex gloves   No current facility-administered medications on file prior to encounter.   Current Outpatient Medications on File Prior to Encounter  Medication Sig Dispense Refill  . acetaminophen (TYLENOL) 325 MG tablet Take 650 mg by mouth every 6 (six) hours as needed.    . Cyanocobalamin (VITAMIN B-12) 1000 MCG SUBL Place 1 tablet under the tongue at bedtime.    . Multiple  Vitamins-Minerals (ADULT ONE DAILY GUMMIES) CHEW Chew 2 tablets by mouth daily.      Social History   Socioeconomic History  . Marital status: Married    Spouse name: Kipp Brood  . Number of children: Not on file  . Years of education: Not on file  . Highest education level: Not on file  Occupational History  . Occupation: Charity fundraiser   Tobacco Use  . Smoking status: Never Smoker  . Smokeless tobacco: Never Used  Vaping Use  . Vaping Use: Never used  Substance and Sexual Activity  . Alcohol use: No    Alcohol/week: 0.0 standard drinks  . Drug use: No  . Sexual activity: Yes    Birth control/protection: None    Comment: intercourse age 47, sexual partners less than 5  Other Topics Concern  . Not on file  Social History Narrative  . Not on file   Social Determinants of Health   Financial Resource Strain:   . Difficulty of Paying Living Expenses: Not on file  Food Insecurity:   . Worried About Programme researcher, broadcasting/film/video in the Last Year: Not on file  . Ran Out of Food in the Last Year: Not on file  Transportation Needs:   . Lack of Transportation (Medical): Not on file  . Lack of Transportation (Non-Medical): Not on file  Physical Activity:   . Days of Exercise per Week: Not on file  . Minutes of Exercise per Session: Not  on file  Stress:   . Feeling of Stress : Not on file  Social Connections:   . Frequency of Communication with Friends and Family: Not on file  . Frequency of Social Gatherings with Friends and Family: Not on file  . Attends Religious Services: Not on file  . Active Member of Clubs or Organizations: Not on file  . Attends Banker Meetings: Not on file  . Marital Status: Not on file  Intimate Partner Violence:   . Fear of Current or Ex-Partner: Not on file  . Emotionally Abused: Not on file  . Physically Abused: Not on file  . Sexually Abused: Not on file   Family History  Problem Relation Age of Onset  . Hypertension Father   . Diabetes Maternal  Grandmother   . Heart disease Maternal Grandmother   . Hypertension Paternal Grandfather   . Breast cancer Paternal Grandmother 74    OBJECTIVE:  Vitals:   10/30/20 1514  BP: 106/73  Pulse: 97  Resp: 18  Temp: 97.7 F (36.5 C)  SpO2: 98%     General appearance: Alert, NAD, appears stated age Head: NCAT Throat: lips, mucosa, and tongue normal; teeth and gums normal Lungs: CTA bilaterally without adventitious breath sounds Heart: regular rate and rhythm.  Back: no CVA tenderness Abdomen: soft, non-tender; bowel sounds normal; no guarding GU: deferred Skin: warm and dry Psychological:  Alert and cooperative. Normal mood and affect.  LABS:  Results for orders placed or performed during the hospital encounter of 10/30/20  POCT urinalysis dipstick  Result Value Ref Range   Color, UA yellow yellow   Clarity, UA clear clear   Glucose, UA negative negative mg/dL   Bilirubin, UA negative negative   Ketones, POC UA negative negative mg/dL   Spec Grav, UA 7.353 2.992 - 1.025   Blood, UA trace-intact (A) negative   pH, UA 7.0 5.0 - 8.0   Protein Ur, POC negative negative mg/dL   Urobilinogen, UA 0.2 0.2 or 1.0 E.U./dL   Nitrite, UA Negative Negative   Leukocytes, UA Small (1+) (A) Negative    Labs Reviewed  POCT URINALYSIS DIP (MANUAL ENTRY) - Abnormal; Notable for the following components:      Result Value   Blood, UA trace-intact (*)    Leukocytes, UA Small (1+) (*)    All other components within normal limits  URINE CULTURE    ASSESSMENT & PLAN:  1. Vaginal irritation     Meds ordered this encounter  Medications  . fluconazole (DIFLUCAN) 200 MG tablet    Sig: Take one dose by mouth, wait 72 hours, and then take second dose by mouth    Dispense:  2 tablet    Refill:  0    Order Specific Question:   Supervising Provider    Answer:   Eustace Moore [[id number]]  . metroNIDAZOLE (FLAGYL) 500 MG tablet    Sig: Take 1 tablet (500 mg total) by mouth 2 (two)  times daily.    Dispense:  14 tablet    Refill:  0    Order Specific Question:   Supervising Provider    Answer:   Eustace Moore [[id number]]    Pending: Labs Reviewed  POCT URINALYSIS DIP (MANUAL ENTRY) - Abnormal; Notable for the following components:      Result Value   Blood, UA trace-intact (*)    Leukocytes, UA Small (1+) (*)    All other components within normal limits  URINE CULTURE  We will hold off on vaginal swab Prescribed metronidazole 500 mg twice daily for 7 days (do not take while consuming alcohol and/or if breastfeeding) Prescribed diflucan 200 mg once daily and then second dose 72 hours later Take medications as prescribed and to completion Follow up with PCP  Return here or go to ER if you have any new or worsening symptoms fever, chills, nausea, vomiting, abdominal or pelvic pain, painful intercourse, vaginal discharge, vaginal bleeding, persistent symptoms despite treatment, etc...  Reviewed expectations re: course of current medical issues. Questions answered. Outlined signs and symptoms indicating need for more acute intervention. Patient verbalized understanding. After Visit Summary given.       Rennis Harding, PA-C 10/30/20 1547

## 2020-10-30 NOTE — ED Triage Notes (Signed)
Pt presents with yeast infection symptoms that is not improved with over the counter medication, also has mild dysuria

## 2020-10-30 NOTE — Discharge Instructions (Signed)
We will hold off on vaginal swab Prescribed metronidazole 500 mg twice daily for 7 days (do not take while consuming alcohol and/or if breastfeeding) Prescribed diflucan 200 mg once daily and then second dose 72 hours later Take medications as prescribed and to completion Follow up with PCP  Return here or go to ER if you have any new or worsening symptoms fever, chills, nausea, vomiting, abdominal or pelvic pain, painful intercourse, vaginal discharge, vaginal bleeding, persistent symptoms despite treatment, etc..Marland Kitchen

## 2020-10-31 LAB — URINE CULTURE

## 2021-02-25 ENCOUNTER — Other Ambulatory Visit: Payer: Self-pay

## 2021-02-25 ENCOUNTER — Ambulatory Visit
Admission: RE | Admit: 2021-02-25 | Discharge: 2021-02-25 | Disposition: A | Discharge status: Home / Self Care | Payer: 59 | Source: Ambulatory Visit | Attending: Family Medicine | Admitting: Family Medicine

## 2021-02-25 VITALS — BP 98/62 | HR 98 | Temp 98.1°F | Resp 18

## 2021-02-25 DIAGNOSIS — R053 Chronic cough: Secondary | ICD-10-CM

## 2021-02-25 DIAGNOSIS — J0100 Acute maxillary sinusitis, unspecified: Secondary | ICD-10-CM

## 2021-02-25 MED ORDER — DOXYCYCLINE HYCLATE 100 MG PO CAPS: 100.0000 mg | ORAL_CAPSULE | Freq: Two times a day (BID) | ORAL | 0 refills | Status: DC

## 2021-02-25 MED ORDER — BENZONATATE 100 MG PO CAPS: ORAL_CAPSULE | ORAL | 0 refills | Status: AC

## 2021-02-25 NOTE — ED Provider Notes (Signed)
[facility]   [id number] 02/25/21 Arrival Time: 1702  ASSESSMENT & PLAN:  1. Acute non-recurrent maxillary sinusitis   2. Persistent cough     Begin: Meds ordered this encounter  Medications  . doxycycline (VIBRAMYCIN) 100 MG capsule    Sig: Take 1 capsule (100 mg total) by mouth 2 (two) times daily.    Dispense:  20 capsule    Refill:  0  . benzonatate (TESSALON) 100 MG capsule    Sig: Take 1 capsule by mouth every 8 (eight) hours for cough.    Dispense:  21 capsule    Refill:  0    Discussed typical duration of symptoms. OTC symptom care as needed. Ensure adequate fluid intake and rest.   Follow-up Information    Benita Stabile, MD.   Specialty: Internal Medicine Why: As needed. Contact information: [address] [place] [address] [phone]        [facility].   Specialty: Urgent Care Why: If worsening or failing to improve as anticipated. Contact information: [address], [address] [place] [address] [phone]              Reviewed expectations re: course of current medical issues. Questions answered. Outlined signs and symptoms indicating need for more acute intervention. Patient verbalized understanding. After Visit Summary given.   SUBJECTIVE: History from: patient.  Maria Walton is a 35 y.o. female who presents with complaint of nasal congestion, post-nasal drainage, and sinus pain. Onset gradual, at least 1.5 w ago. Respiratory symptoms: persistent dry cough without SOB. Chest is sore from coughing; affecting sleep. Fever: absent. Overall normal PO intake without n/v.  History of frequent sinus infections: no. No specific aggravating or alleviating factors reported.  Social History   Tobacco Use  Smoking Status Never Smoker  Smokeless Tobacco Never Used    OBJECTIVE:  Vitals:   02/25/21 1710  BP: 98/62  Pulse: 98  Resp: 18  Temp: 98.1 F (36.7 C)   TempSrc: Oral  SpO2: 97%     General appearance: alert; no distress HEENT: nasal congestion; clear runny nose; throat irritation secondary to post-nasal drainage; bilateral maxillary pain reported Neck: supple without LAD; trachea midline Lungs: unlabored respirations, symmetrical air entry; cough: mild, dry; no respiratory distress; CTAB Skin: warm and dry Psychological: alert and cooperative; normal mood and affect  Allergies  Allergen Reactions  . Ceclor [Cefaclor] Hives    Childhood allergy  . Promethazine Hcl Other (See Comments)    hyperactivity  . Latex Rash    Rash when using latex gloves    Past Medical History:  Diagnosis Date  . Anemia    Only with this pregnancy; taking iron supplements  . Complication of anesthesia    N/V; rash  . Dizziness   . Endometriosis   . Headache   . Heart palpitations    Family History  Problem Relation Age of Onset  . Hypertension Father   . Diabetes Maternal Grandmother   . Heart disease Maternal Grandmother   . Hypertension Paternal Grandfather   . Breast cancer Paternal Grandmother 90   Social History   Socioeconomic History  . Marital status: Married    Spouse name: Kipp Brood  . Number of children: Not on file  . Years of education: Not on file  . Highest education level: Not on file  Occupational History  . Occupation: Charity fundraiser   Tobacco Use  . Smoking status: Never Smoker  .  Smokeless tobacco: Never Used  Vaping Use  . Vaping Use: Never used  Substance and Sexual Activity  . Alcohol use: No    Alcohol/week: 0.0 standard drinks  . Drug use: No  . Sexual activity: Yes    Birth control/protection: None    Comment: intercourse age 75, sexual partners less than 5  Other Topics Concern  . Not on file  Social History Narrative  . Not on file   Social Determinants of Health   Financial Resource Strain: Not on file  Food Insecurity: Not on file  Transportation Needs: Not on file  Physical Activity: Not on file   Stress: Not on file  Social Connections: Not on file  Intimate Partner Violence: Not on file            Mardella Layman, MD 02/25/21 1745

## 2021-02-25 NOTE — ED Triage Notes (Signed)
Scratchy throat 1.5 weeks,  Dry Cough, sneezing.  Nasal congestion -yellow drainage.

## 2021-04-01 DIAGNOSIS — N939 Abnormal uterine and vaginal bleeding, unspecified: Secondary | ICD-10-CM | POA: Diagnosis not present

## 2021-04-06 ENCOUNTER — Ambulatory Visit
Admission: RE | Admit: 2021-04-06 | Discharge: 2021-04-06 | Disposition: A | Discharge status: Home / Self Care | Payer: 59 | Source: Ambulatory Visit | Attending: Family Medicine | Admitting: Family Medicine

## 2021-04-06 ENCOUNTER — Other Ambulatory Visit: Payer: Self-pay

## 2021-04-06 VITALS — BP 111/65 | HR 92 | Temp 99.2°F | Resp 17

## 2021-04-06 DIAGNOSIS — B9789 Other viral agents as the cause of diseases classified elsewhere: Secondary | ICD-10-CM

## 2021-04-06 DIAGNOSIS — R0981 Nasal congestion: Secondary | ICD-10-CM | POA: Diagnosis not present

## 2021-04-06 DIAGNOSIS — J020 Streptococcal pharyngitis: Secondary | ICD-10-CM

## 2021-04-06 DIAGNOSIS — H5789 Other specified disorders of eye and adnexa: Secondary | ICD-10-CM

## 2021-04-06 MED ORDER — OLOPATADINE HCL 0.1 % OP SOLN: 1.0000 [drp] | Freq: Two times a day (BID) | OPHTHALMIC | 12 refills | Status: AC | PRN

## 2021-04-06 MED ORDER — PREDNISONE 20 MG PO TABS: 20.0000 mg | ORAL_TABLET | Freq: Every day | ORAL | 0 refills | Status: AC

## 2021-04-06 MED ORDER — LEVOCETIRIZINE DIHYDROCHLORIDE 5 MG PO TABS: 5.0000 mg | ORAL_TABLET | Freq: Every evening | ORAL | 0 refills | Status: AC

## 2021-04-06 NOTE — ED Triage Notes (Signed)
Sore throat and congestion x 1 week.  Seen for similar s/s over a month ago.  Was prescribed abx at last visit but did not finish completely.

## 2021-04-06 NOTE — ED Provider Notes (Signed)
[facility]    CSN: [id number] Arrival date & time: 04/06/21  1054      History   Chief Complaint Chief Complaint  Patient presents with  . Sore Throat    HPI Maria Walton is a 35 y.o. female.   HPI Patient presents with nasal congestion and sore throat. Seen 6 weeks ago for the same symptoms placed an antibiotic for acute sinusitis. No current antihistamine therapy. Symptoms somewhat improved with prior treatment however, she has continued to have some degree of drainage and or congestion. Afebrile. Past Medical History:  Diagnosis Date  . Anemia    Only with this pregnancy; taking iron supplements  . Complication of anesthesia    N/V; rash  . Dizziness   . Endometriosis   . Headache   . Heart palpitations     Patient Active Problem List   Diagnosis Date Noted  . Indication for care in labor or delivery 05/16/2019  . NSVD (normal spontaneous vaginal delivery) 05/16/2019  . Preterm labor 04/17/2019  . Normal labor 07/15/2017  . Other specified hypotension 08/06/2015  . Abdominal pain 02/05/2015  . Autonomic dysfunction 02/05/2015  . Dizziness   . [id number](349.1)     Past Surgical History:  Procedure Laterality Date  . APPENDECTOMY     dr Lovell Sheehan 1999  . CHOLECYSTECTOMY  06/05/2012   Procedure: LAPAROSCOPIC CHOLECYSTECTOMY;  Surgeon: Marlane Hatcher, MD;  Location: [facility];  Service: General;  Laterality: N/A;  . PELVIC LAPAROSCOPY  2017   Endometriosis    OB History    Gravida  2   Para  2   Term  2   Preterm      AB      Living  2     SAB      IAB      Ectopic      Multiple  0   Live Births  2            Home Medications    Prior to Admission medications   Medication Sig Start Date End Date Taking? Authorizing Provider  levocetirizine (XYZAL) 5 MG tablet Take 1 tablet (5 mg total) by mouth every evening. 04/06/21  Yes Bing Neighbors, FNP  olopatadine (PATANOL) 0.1 % ophthalmic solution Place 1 drop into  both eyes 2 (two) times daily as needed for allergies. 04/06/21  Yes Bing Neighbors, FNP  predniSONE (DELTASONE) 20 MG tablet Take 1 tablet (20 mg total) by mouth daily with breakfast for 5 days. 04/06/21 04/11/21 Yes Bing Neighbors, FNP  acetaminophen (TYLENOL) 325 MG tablet Take 650 mg by mouth every 6 (six) hours as needed.    [provider]  benzonatate (TESSALON) 100 MG capsule Take 1 capsule by mouth every 8 (eight) hours for cough. 02/25/21   Mardella Layman, MD  Cyanocobalamin (VITAMIN B-12) 1000 MCG SUBL Place 1 tablet under the tongue at bedtime.    [provider]  doxycycline (VIBRAMYCIN) 100 MG capsule Take 1 capsule (100 mg total) by mouth 2 (two) times daily. 02/25/21   Mardella Layman, MD  Multiple Vitamins-Minerals (ADULT ONE DAILY GUMMIES) CHEW Chew 2 tablets by mouth daily.    [provider]    Family History Family History  Problem Relation Age of Onset  . Hypertension Father   . Diabetes Maternal Grandmother   . Heart disease Maternal Grandmother   . Hypertension Paternal Grandfather   . Breast cancer Paternal Grandmother 12    Social History  Social History   Tobacco Use  . Smoking status: Never Smoker  . Smokeless tobacco: Never Used  Vaping Use  . Vaping Use: Never used  Substance Use Topics  . Alcohol use: No    Alcohol/week: 0.0 standard drinks  . Drug use: No     Allergies   Ceclor [cefaclor], Promethazine hcl, and Latex   Review of Systems Review of Systems Pertinent negatives listed in HPI  Physical Exam Triage Vital Signs ED Triage Vitals  Enc Vitals Group     BP 04/06/21 1115 111/65     Pulse Rate 04/06/21 1115 92     Resp 04/06/21 1115 17     Temp 04/06/21 1115 99.2 F (37.3 C)     Temp Source 04/06/21 1115 Oral     SpO2 04/06/21 1115 96 %     Weight --      Height --      Head Circumference --      Peak Flow --      Pain Score 04/06/21 1114 5     Pain Loc --      Pain Edu? --      Excl. in GC? --     No data found.  Updated Vital Signs BP 111/65 (BP Location: Right Arm)   Pulse 92   Temp 99.2 F (37.3 C) (Oral)   Resp 17   SpO2 96%   Visual Acuity Right Eye Distance:   Left Eye Distance:   Bilateral Distance:    Right Eye Near:   Left Eye Near:    Bilateral Near:     Physical Exam  General Appearance:    Alert, cooperative, no distress  HENT:   Normocephalic, ears normal, nares mucosal edema with congestion, rhinorrhea, oropharynx w/o erythema or exudate  Eyes:    PERRL, conjunctiva/corneas clear, EOM's intact       Lungs:     Clear to auscultation bilaterally, respirations unlabored  Heart:    Regular rate and rhythm  Neurologic:   Awake, alert, oriented x 3. No apparent focal neurological           defect.      UC Treatments / Results  Labs (all labs ordered are listed, but only abnormal results are displayed) Labs Reviewed - No data to display  EKG   Radiology No results found.  Procedures Procedures (including critical care time)  Medications Ordered in UC Medications - No data to display  Initial Impression / Assessment and Plan / UC Course  I have reviewed the triage vital signs and the nursing notes.  Pertinent labs & imaging results that were available during my care of the patient were reviewed by me and considered in my medical decision making (see chart for details).    Nasal congestion, sore throat, eye irritation, symptomatic treatment warranted only given etiology appears to be viral in nature. Management per discharge ordered medications. PCP follow-up as needed. Final Clinical Impressions(s) / UC Diagnoses   Final diagnoses:  Nasal congestion  Sore throat (viral)  Eye irritation   Discharge Instructions   None    ED Prescriptions    Medication Sig Dispense Auth. Provider   levocetirizine (XYZAL) 5 MG tablet Take 1 tablet (5 mg total) by mouth every evening. 90 tablet Bing Neighbors, FNP   predniSONE (DELTASONE) 20 MG  tablet Take 1 tablet (20 mg total) by mouth daily with breakfast for 5 days. 5 tablet Bing Neighbors, FNP   olopatadine (PATANOL) 0.1 %  ophthalmic solution Place 1 drop into both eyes 2 (two) times daily as needed for allergies. 5 mL Bing Neighbors, FNP     PDMP not reviewed this encounter.   Bing Neighbors, FNP 04/09/21 1141

## 2021-04-17 ENCOUNTER — Other Ambulatory Visit: Payer: Self-pay

## 2021-04-17 ENCOUNTER — Ambulatory Visit (INDEPENDENT_AMBULATORY_CARE_PROVIDER_SITE_OTHER): Payer: 59

## 2021-04-17 ENCOUNTER — Ambulatory Visit
Admission: RE | Admit: 2021-04-17 | Discharge: 2021-04-17 | Disposition: A | Discharge status: Home / Self Care | Payer: 59 | Source: Ambulatory Visit | Attending: Emergency Medicine | Admitting: Emergency Medicine

## 2021-04-17 VITALS — BP 99/61 | HR 88 | Temp 98.3°F | Resp 18

## 2021-04-17 DIAGNOSIS — R059 Cough, unspecified: Secondary | ICD-10-CM

## 2021-04-17 DIAGNOSIS — R0789 Other chest pain: Secondary | ICD-10-CM | POA: Diagnosis not present

## 2021-04-17 MED ORDER — HYDROCOD POLST-CPM POLST ER 10-8 MG/5ML PO SUER: 5.0000 mL | Freq: Two times a day (BID) | ORAL | 0 refills | Status: AC | PRN

## 2021-04-17 MED ORDER — PREDNISONE 20 MG PO TABS: 40.0000 mg | ORAL_TABLET | Freq: Every day | ORAL | 0 refills | Status: AC

## 2021-04-17 NOTE — Discharge Instructions (Addendum)
See you have a prescription of Tessalon already.  Start taking that during the day.  Tussionex for the cough at night.  Take [phone] mg of Tylenol combined with 400 mg of ibuprofen 3-4 times a day as needed for pain.  I am increasing the prednisone from 20 mg to 40 mg once a day for 5 days.

## 2021-04-17 NOTE — ED Triage Notes (Signed)
Pt presents with c/o right rib pain that became worse from coughing , pain is sharp and stabbing

## 2021-04-17 NOTE — ED Provider Notes (Signed)
HPI  SUBJECTIVE:  Maria Walton is a 35 y.o. female who presents with sharp, stabbing, right back and mid/upper rib pain that radiates around to the front starting after having a URI last week with lots of coughing.  Has continued coughing.  She states the pain is constant, she has shortness of breath secondary to the pain.  No fevers, wheezing, rash.  No calf pain, swelling, hemoptysis, surgery in the past 4 weeks, recent immobilization, exogenous estrogen use.  No trauma to the chest.  She has tried rest, Tylenol and over-the-counter Robitussin without improvement in her symptoms.  Symptoms worse with lying down, coughing, deep inspiration, torso rotation and lateral bending.  She states it is difficult to go to sleep because of the coughing, but when she falls asleep, she is able to stay asleep.  She was seen here on 4/25 for URI-like symptoms, was prescribed 20 mg of prednisone for 5 days, Xyzal, Patanol.  She states that she did not take the prednisone.  She has a past medical history of varicella x2.  No history of PE, DVT, cancer, shingles, pulmonary disease, smoking, diabetes, hypertension.  LMP: 4/10.  Denies possibility of being pregnant.  Has an IUD.  VCB:SWHQ, Kathleene Hazel, MD   Past Medical History:  Diagnosis Date  . Anemia    Only with this pregnancy; taking iron supplements  . Complication of anesthesia    N/V; rash  . Dizziness   . Endometriosis   . Headache   . Heart palpitations     Past Surgical History:  Procedure Laterality Date  . APPENDECTOMY     dr Lovell Sheehan 1999  . CHOLECYSTECTOMY  06/05/2012   Procedure: LAPAROSCOPIC CHOLECYSTECTOMY;  Surgeon: Marlane Hatcher, MD;  Location: [facility];  Service: General;  Laterality: N/A;  . PELVIC LAPAROSCOPY  2017   Endometriosis    Family History  Problem Relation Age of Onset  . Hypertension Father   . Diabetes Maternal Grandmother   . Heart disease Maternal Grandmother   . Hypertension Paternal Grandfather   . Breast cancer  Paternal Grandmother 16    Social History   Tobacco Use  . Smoking status: Never Smoker  . Smokeless tobacco: Never Used  Vaping Use  . Vaping Use: Never used  Substance Use Topics  . Alcohol use: No    Alcohol/week: 0.0 standard drinks  . Drug use: No    No current facility-administered medications for this encounter.  Current Outpatient Medications:  .  chlorpheniramine-HYDROcodone (TUSSIONEX PENNKINETIC ER) 10-8 MG/5ML SUER, Take 5 mLs by mouth every 12 (twelve) hours as needed for cough., Disp: 60 mL, Rfl: 0 .  predniSONE (DELTASONE) 20 MG tablet, Take 2 tablets (40 mg total) by mouth daily with breakfast for 5 days., Disp: 10 tablet, Rfl: 0 .  acetaminophen (TYLENOL) 325 MG tablet, Take 650 mg by mouth every 6 (six) hours as needed., Disp: , Rfl:  .  benzonatate (TESSALON) 100 MG capsule, Take 1 capsule by mouth every 8 (eight) hours for cough., Disp: 21 capsule, Rfl: 0 .  Cyanocobalamin (VITAMIN B-12) 1000 MCG SUBL, Place 1 tablet under the tongue at bedtime., Disp: , Rfl:  .  levocetirizine (XYZAL) 5 MG tablet, Take 1 tablet (5 mg total) by mouth every evening., Disp: 90 tablet, Rfl: 0 .  Multiple Vitamins-Minerals (ADULT ONE DAILY GUMMIES) CHEW, Chew 2 tablets by mouth daily., Disp: , Rfl:  .  olopatadine (PATANOL) 0.1 % ophthalmic solution, Place 1 drop into both eyes 2 (two)  times daily as needed for allergies., Disp: 5 mL, Rfl: 12  Allergies  Allergen Reactions  . Ceclor [Cefaclor] Hives    Childhood allergy  . Promethazine Hcl Other (See Comments)    hyperactivity  . Latex Rash    Rash when using latex gloves     ROS  As noted in HPI.   Physical Exam  BP 99/61   Pulse 88   Temp 98.3 F (36.8 C)   Resp 18   SpO2 96%   Constitutional: Well developed, well nourished, no acute distress.  Coughing Eyes:  EOMI, conjunctiva normal bilaterally HENT: Normocephalic, atraumatic,mucus membranes moist Respiratory: Normal inspiratory effort.  Positive right-sided  chest wall tenderness along ribs #4 and 5 posteriorly around the scapula and going around to the front.  No crepitus.  No rash in this area.  Lungs clear bilaterally. Cardiovascular: Normal rate regular rhythm GI: nondistended skin: No rash, skin intact Musculoskeletal: Calves symmetric, nontender, no edema Neurologic: Alert & oriented x 3, no focal neuro deficits Psychiatric: Speech and behavior appropriate   ED Course   Medications - No data to display  Orders Placed This Encounter  Procedures  . DG Chest 2 View    Standing Status:   Standing    Number of Occurrences:   1    Order Specific Question:   Reason for Exam (SYMPTOM  OR DIAGNOSIS REQUIRED)    Answer:   pain in right rip    No results found for this or any previous visit (from the past 24 hour(s)). DG Chest 2 View  Result Date: 04/17/2021 CLINICAL DATA:  Cough EXAM: CHEST - 2 VIEW COMPARISON:  12/30/2014 FINDINGS: Hyperinflated lungs with bronchitic changes. No focal opacity or pleural effusion. Normal cardiomediastinal silhouette. No pneumothorax. Scoliosis of the spine. IMPRESSION: No active cardiopulmonary disease. Hyperinflation with bronchitic changes Electronically Signed   By: Jasmine Pang M.D.   On: 04/17/2021 16:46    ED Clinical Impression  1. Musculoskeletal chest pain   2. Cough      ED Assessment/Plan  [place] Narcotic database reviewed for this patient, and feel that the risk/benefit ratio today is favorable for proceeding with a prescription for controlled substance.  No opiate prescriptions in 2 years  Reviewed imaging independently.  Hyperinflation with bronchitic changes.  No active cardiopulmonary disease.  See radiology report for full details.  Patient PERC negative.  Doubt PE.  There is no evidence of pneumothorax, pneumonia, rib fracture on chest x-ray.  Suspect musculoskeletal chest wall pain versus pleurisy.  Will send home with Tylenol/ibuprofen, Tussionex, Tessalon, and increase the prednisone  to 40 mg a day for 5 days.  Discussed  imaging, MDM, treatment plan, and plan for follow-up with patient. Discussed sn/sx that should prompt return to the ED. patient agrees with plan.   Meds ordered this encounter  Medications  . chlorpheniramine-HYDROcodone (TUSSIONEX PENNKINETIC ER) 10-8 MG/5ML SUER    Sig: Take 5 mLs by mouth every 12 (twelve) hours as needed for cough.    Dispense:  60 mL    Refill:  0  . predniSONE (DELTASONE) 20 MG tablet    Sig: Take 2 tablets (40 mg total) by mouth daily with breakfast for 5 days.    Dispense:  10 tablet    Refill:  0      *This clinic note was created using Scientist, clinical (histocompatibility and immunogenetics). Therefore, there may be occasional mistakes despite careful proofreading.  ?    Domenick Gong, MD 04/18/21 [phone]

## 2021-05-18 IMAGING — DX DG CHEST 2V
2 series · 2 of 2 positions shown · non-contrast
Comparison: 12/30/2014

CLINICAL DATA: Cough

EXAM:
CHEST - 2 VIEW

[chest pa]
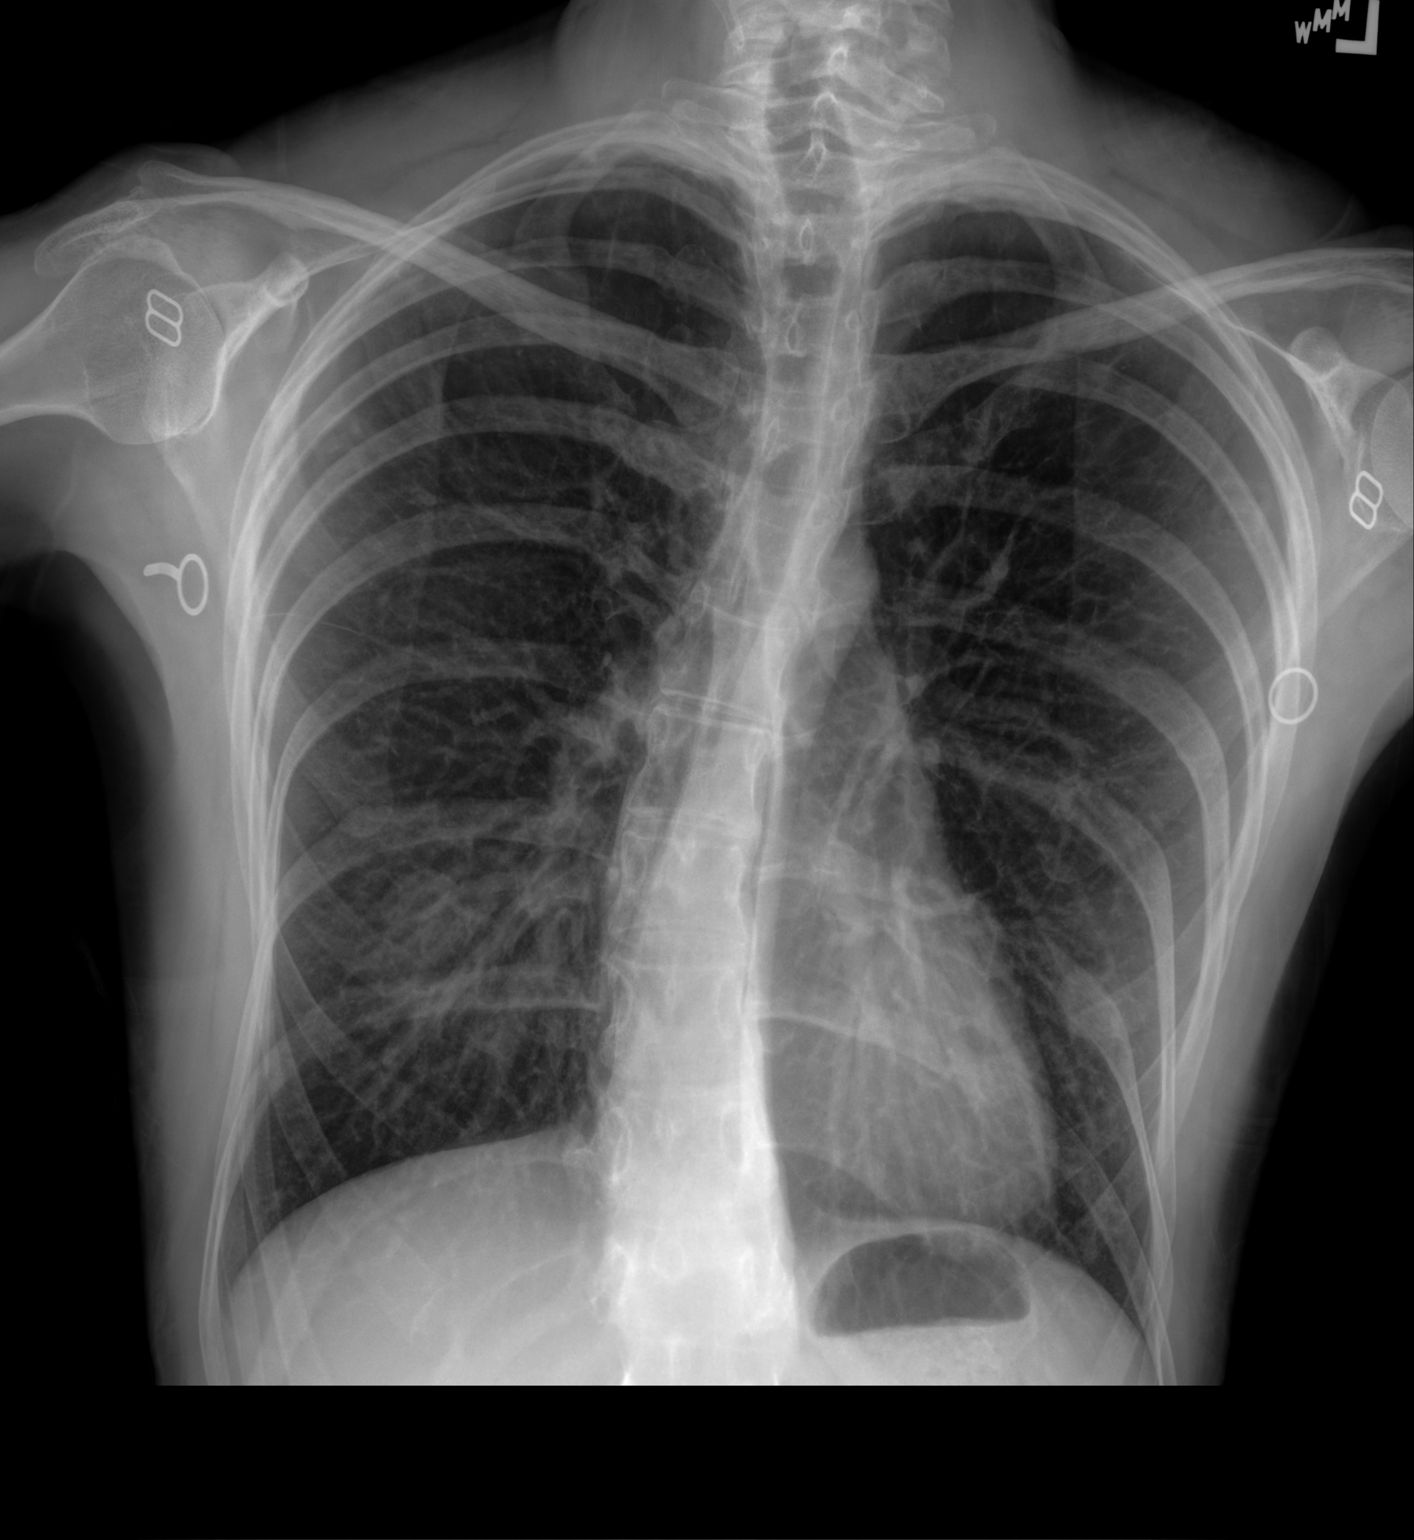

[chest lat]
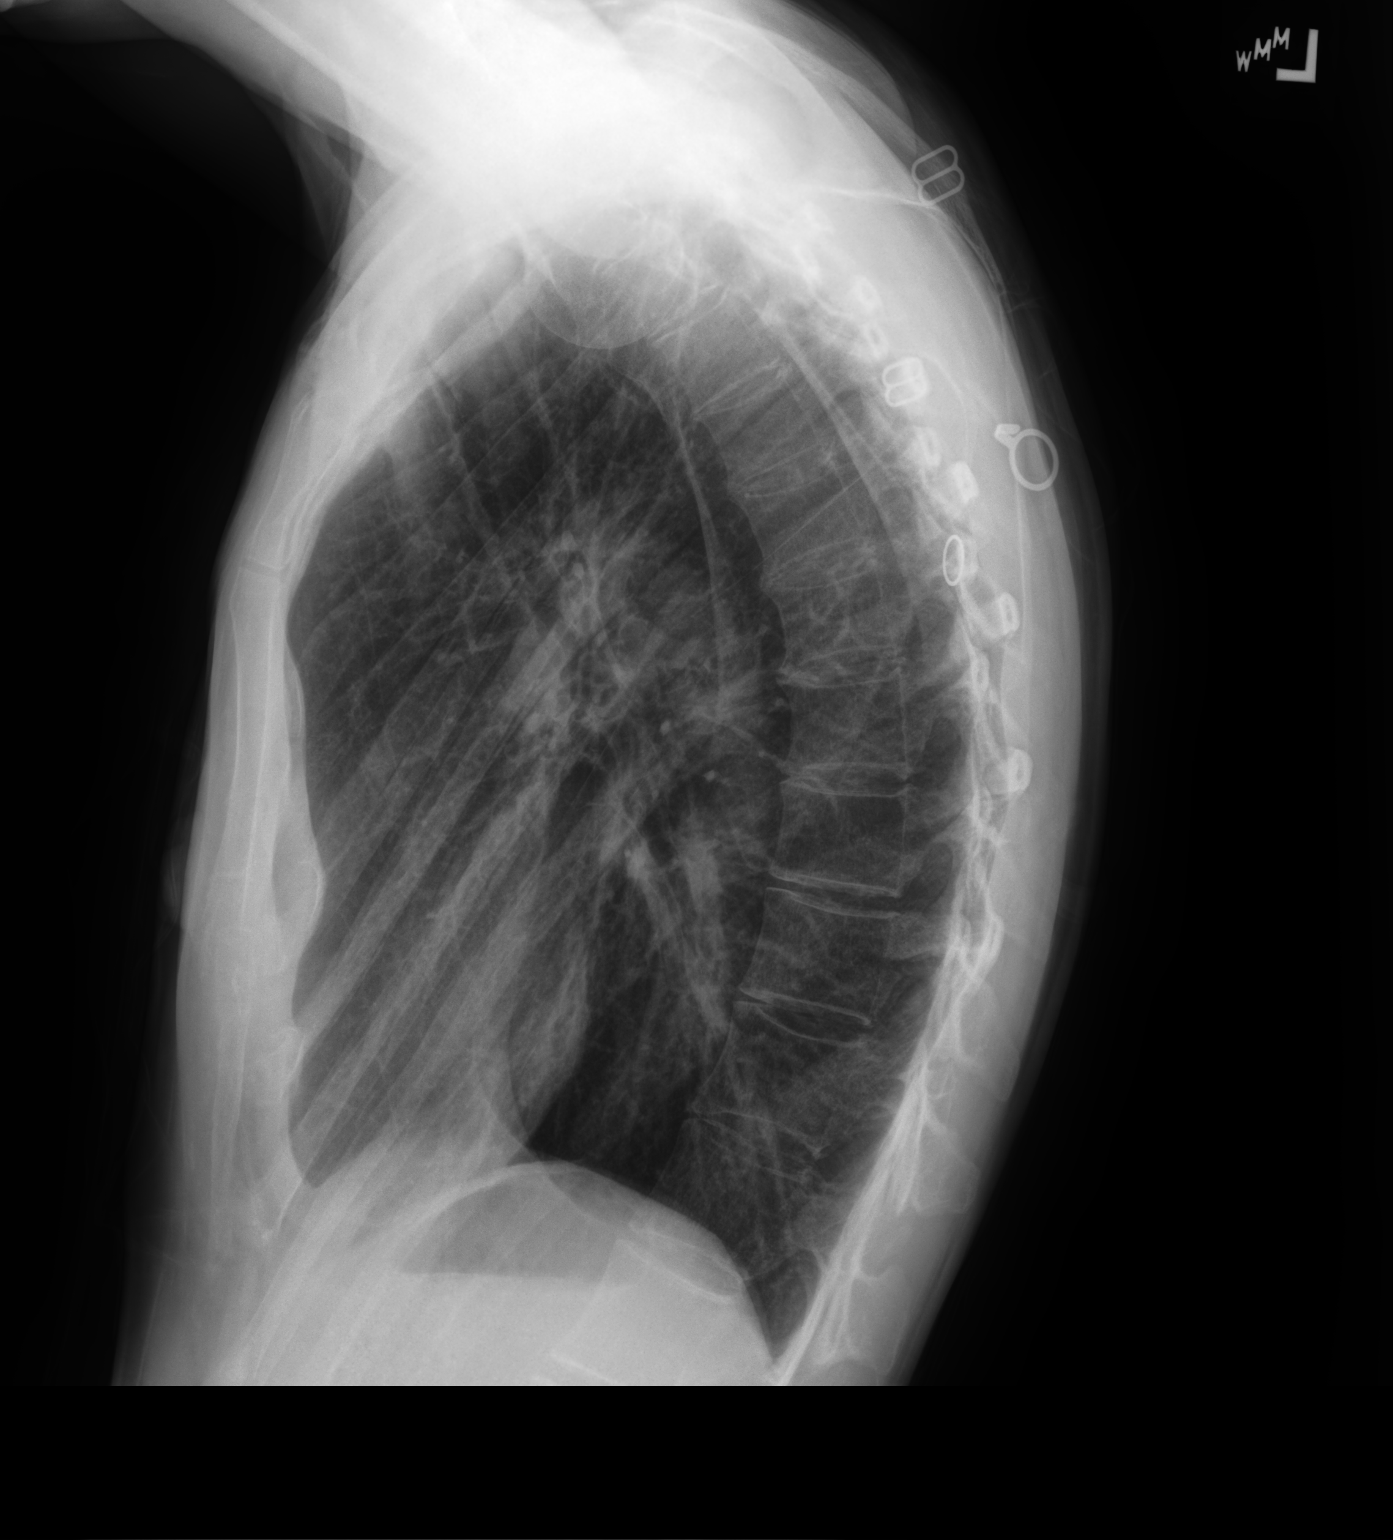

[2 of 2 positions shown; findings below may reference images not displayed]

FINDINGS: Hyperinflated lungs with bronchitic changes. No focal opacity or
pleural effusion. Normal cardiomediastinal silhouette. No
pneumothorax. Scoliosis of the spine.
IMPRESSION: No active cardiopulmonary disease. Hyperinflation with bronchitic
changes

## 2021-05-25 DIAGNOSIS — M7052 Other bursitis of knee, left knee: Secondary | ICD-10-CM | POA: Diagnosis not present

## 2021-05-25 DIAGNOSIS — Z0000 Encounter for general adult medical examination without abnormal findings: Secondary | ICD-10-CM | POA: Diagnosis not present

## 2021-05-25 DIAGNOSIS — G43119 Migraine with aura, intractable, without status migrainosus: Secondary | ICD-10-CM | POA: Diagnosis not present

## 2021-05-25 DIAGNOSIS — N921 Excessive and frequent menstruation with irregular cycle: Secondary | ICD-10-CM | POA: Diagnosis not present

## 2021-06-12 DIAGNOSIS — N939 Abnormal uterine and vaginal bleeding, unspecified: Secondary | ICD-10-CM | POA: Diagnosis not present

## 2021-06-12 DIAGNOSIS — R636 Underweight: Secondary | ICD-10-CM | POA: Diagnosis not present

## 2021-06-12 DIAGNOSIS — Z01419 Encounter for gynecological examination (general) (routine) without abnormal findings: Secondary | ICD-10-CM | POA: Diagnosis not present

## 2021-06-12 DIAGNOSIS — Z309 Encounter for contraceptive management, unspecified: Secondary | ICD-10-CM | POA: Diagnosis not present

## 2021-06-12 DIAGNOSIS — Z01411 Encounter for gynecological examination (general) (routine) with abnormal findings: Secondary | ICD-10-CM | POA: Diagnosis not present

## 2021-06-12 DIAGNOSIS — Z681 Body mass index (BMI) 19 or less, adult: Secondary | ICD-10-CM | POA: Diagnosis not present

## 2021-06-24 DIAGNOSIS — N939 Abnormal uterine and vaginal bleeding, unspecified: Secondary | ICD-10-CM | POA: Diagnosis not present

## 2021-06-24 DIAGNOSIS — Z30431 Encounter for routine checking of intrauterine contraceptive device: Secondary | ICD-10-CM | POA: Diagnosis not present

## 2022-01-15 DIAGNOSIS — R5383 Other fatigue: Secondary | ICD-10-CM | POA: Diagnosis not present

## 2022-01-15 DIAGNOSIS — M25512 Pain in left shoulder: Secondary | ICD-10-CM | POA: Diagnosis not present

## 2022-01-15 DIAGNOSIS — M25562 Pain in left knee: Secondary | ICD-10-CM | POA: Diagnosis not present

## 2022-01-15 DIAGNOSIS — R0789 Other chest pain: Secondary | ICD-10-CM | POA: Diagnosis not present

## 2022-01-15 DIAGNOSIS — M7052 Other bursitis of knee, left knee: Secondary | ICD-10-CM | POA: Diagnosis not present

## 2022-01-15 DIAGNOSIS — G8929 Other chronic pain: Secondary | ICD-10-CM | POA: Diagnosis not present

## 2022-01-25 DIAGNOSIS — N644 Mastodynia: Secondary | ICD-10-CM | POA: Diagnosis not present

## 2022-01-25 DIAGNOSIS — R922 Inconclusive mammogram: Secondary | ICD-10-CM | POA: Diagnosis not present

## 2022-04-03 ENCOUNTER — Ambulatory Visit
Admission: RE | Admit: 2022-04-03 | Discharge: 2022-04-03 | Disposition: A | Discharge status: Home / Self Care | Payer: 59 | Source: Ambulatory Visit | Attending: Family Medicine | Admitting: Family Medicine

## 2022-04-03 VITALS — BP 87/58 | HR 80 | Temp 98.1°F

## 2022-04-03 DIAGNOSIS — J029 Acute pharyngitis, unspecified: Secondary | ICD-10-CM | POA: Diagnosis not present

## 2022-04-03 LAB — POCT RAPID STREP A (OFFICE): Rapid Strep A Screen: NEGATIVE

## 2022-04-03 MED ORDER — LIDOCAINE VISCOUS HCL 2 % MT SOLN: 10.0000 mL | OROMUCOSAL | 0 refills | Status: AC | PRN

## 2022-04-03 NOTE — ED Provider Notes (Signed)
?[facility] ? ? ? ?CSN: [id number] ?Arrival date & time: 04/03/22  1243 ? ? ?  ? ?History   ?Chief Complaint ?Chief Complaint  ?Patient presents with  ? Sore Throat  ?  Terrible sore throat that has gotten worse over the last week, originally thought it was allergy/sinus. COVID test negative but back of mouth and throat feel like there's ulcers or something. Painful now even when not swallowing. No fevers - Entered by patient  ? ? ?HPI ?Maria Walton is a 36 y.o. female.  ? ?Presenting today with a sore throat, cough, headache that has been ongoing for a full week now.  Denies fever, chills, body aches, chest pain, shortness of breath, abdominal pain, nausea vomiting or diarrhea.  States she has not been taking anything other than Tylenol for symptoms.  Took a COVID test early on into symptoms and states it was negative.  No known sick contacts recently.  No known pertinent chronic medical problems. ? ? ?Past Medical History:  ?Diagnosis Date  ? Anemia   ? Only with this pregnancy; taking iron supplements  ? Complication of anesthesia   ? N/V; rash  ? Dizziness   ? Endometriosis   ? Headache   ? Heart palpitations   ? ? ?Patient Active Problem List  ? Diagnosis Date Noted  ? Indication for care in labor or delivery 05/16/2019  ? NSVD (normal spontaneous vaginal delivery) 05/16/2019  ? Preterm labor 04/17/2019  ? Normal labor 07/15/2017  ? Other specified hypotension 08/06/2015  ? Abdominal pain 02/05/2015  ? Autonomic dysfunction 02/05/2015  ? Dizziness   ? Headache(784.0)   ? ? ?Past Surgical History:  ?Procedure Laterality Date  ? APPENDECTOMY    ? dr Arnoldo Morale 1999  ? CHOLECYSTECTOMY  06/05/2012  ? Procedure: LAPAROSCOPIC CHOLECYSTECTOMY;  Surgeon: Scherry Ran, MD;  Location: [facility];  Service: General;  Laterality: N/A;  ? PELVIC LAPAROSCOPY  2017  ? Endometriosis  ? ?OB History   ? ? Gravida  ?2  ? Para  ?2  ? Term  ?2  ? Preterm  ?   ? AB  ?   ? Living  ?2  ?  ? ? SAB  ?   ? IAB  ?   ? Ectopic   ?   ? Multiple  ?0  ? Live Births  ?2  ?   ?  ?  ? ? ?Home Medications   ? ?Prior to Admission medications   ?Medication Sig Start Date End Date Taking? Authorizing Provider  ?lidocaine (XYLOCAINE) 2 % solution Use as directed 10 mLs in the mouth or throat every 3 (three) hours as needed for mouth pain. 04/03/22  Yes Volney American, PA-C  ?acetaminophen (TYLENOL) 325 MG tablet Take 650 mg by mouth every 6 (six) hours as needed.    [provider]  ?benzonatate (TESSALON) 100 MG capsule Take 1 capsule by mouth every 8 (eight) hours for cough. 02/25/21   Vanessa Kick, MD  ?chlorpheniramine-HYDROcodone (TUSSIONEX PENNKINETIC ER) 10-8 MG/5ML SUER Take 5 mLs by mouth every 12 (twelve) hours as needed for cough. 04/17/21   Melynda Ripple, MD  ?Cyanocobalamin (VITAMIN B-12) 1000 MCG SUBL Place 1 tablet under the tongue at bedtime.    [provider]  ?levocetirizine (XYZAL) 5 MG tablet Take 1 tablet (5 mg total) by mouth every evening. 04/06/21   Scot Jun, FNP  ?levonorgestrel (MIRENA, 52 MG,) 20 MCG/DAY IUD Mirena 20 mcg/24 hours (7 yrs)  52 mg intrauterine device ? Take 1 device by intrauterine route.    [provider]  ?Multiple Vitamins-Minerals (ADULT ONE DAILY GUMMIES) CHEW Chew 2 tablets by mouth daily.    [provider]  ?NURTEC 75 MG TBDP Take 1 tablet by mouth daily as needed. 02/15/22   [provider]  ?olopatadine (PATANOL) 0.1 % ophthalmic solution Place 1 drop into both eyes 2 (two) times daily as needed for allergies. 04/06/21   Scot Jun, FNP  ? ? ?Family History ?Family History  ?Problem Relation Age of Onset  ? Hypertension Father   ? Diabetes Maternal Grandmother   ? Heart disease Maternal Grandmother   ? Hypertension Paternal Grandfather   ? Breast cancer Paternal Grandmother 29  ? ? ?Social History ?Social History  ? ?Tobacco Use  ? Smoking status: Never  ?  Passive exposure: Never  ? Smokeless tobacco: Never  ?Vaping Use  ? Vaping  Use: Never used  ?Substance Use Topics  ? Alcohol use: No  ?  Alcohol/week: 0.0 standard drinks  ? Drug use: No  ? ? ? ?Allergies   ?Ceclor [cefaclor], Promethazine hcl, and Latex ? ? ?Review of Systems ?Review of Systems ?Per HPI ? ?Physical Exam ?Triage Vital Signs ?ED Triage Vitals  ?Enc Vitals Group  ?   BP 04/03/22 1339 (!) 87/58  ?   Pulse Rate 04/03/22 1339 80  ?   Resp --   ?   Temp 04/03/22 1339 98.1 ?F (36.7 ?C)  ?   Temp Source 04/03/22 1339 Oral  ?   SpO2 04/03/22 1339 96 %  ?   Weight --   ?   Height --   ?   Head Circumference --   ?   Peak Flow --   ?   Pain Score 04/03/22 1335 5  ?   Pain Loc --   ?   Pain Edu? --   ?   Excl. in [place]? --   ? ?No data found. ? ?Updated Vital Signs ?BP (!) 87/58 (BP Location: Right Arm)   Pulse 80   Temp 98.1 ?F (36.7 ?C) (Oral)   SpO2 96%   Breastfeeding No  ? ?Visual Acuity ?Right Eye Distance:   ?Left Eye Distance:   ?Bilateral Distance:   ? ?Right Eye Near:   ?Left Eye Near:    ?Bilateral Near:    ? ?Physical Exam ?Vitals and nursing note reviewed.  ?Constitutional:   ?   Appearance: Normal appearance.  ?HENT:  ?   Head: Atraumatic.  ?   Right Ear: Tympanic membrane and external ear normal.  ?   Left Ear: Tympanic membrane and external ear normal.  ?   Nose: Rhinorrhea present.  ?   Mouth/Throat:  ?   Mouth: Mucous membranes are moist.  ?   Pharynx: Posterior oropharyngeal erythema present. No oropharyngeal exudate.  ?   Comments: No tonsillar edema, uvula midline, oral airway patent ?Eyes:  ?   Extraocular Movements: Extraocular movements intact.  ?   Conjunctiva/sclera: Conjunctivae normal.  ?Cardiovascular:  ?   Rate and Rhythm: Normal rate and regular rhythm.  ?   Heart sounds: Normal heart sounds.  ?Pulmonary:  ?   Effort: Pulmonary effort is normal.  ?   Breath sounds: Normal breath sounds. No wheezing or rales.  ?Musculoskeletal:     ?   General: Normal range of motion.  ?   Cervical back: Normal range of motion and neck supple.  ?Lymphadenopathy:  ?  Cervical: No cervical adenopathy.  ?Skin: ?   General: Skin is warm and dry.  ?Neurological:  ?   Mental Status: She is alert and oriented to person, place, and time.  ?Psychiatric:     ?   Mood and Affect: Mood normal.     ?   Thought Content: Thought content normal.  ? ?UC Treatments / Results  ?Labs ?(all labs ordered are listed, but only abnormal results are displayed) ?Labs Reviewed  ?CULTURE, GROUP A STREP [facility])  ?POCT RAPID STREP A (OFFICE)  ? ? ?EKG ? ? ?Radiology ?No results found. ? ?Procedures ?Procedures (including critical care time) ? ?Medications Ordered in UC ?Medications - No data to display ? ?Initial Impression / Assessment and Plan / UC Course  ?I have reviewed the triage vital signs and the nursing notes. ? ?Pertinent labs & imaging results that were available during my care of the patient were reviewed by me and considered in my medical decision making (see chart for details). ? ?  ? ?Vital signs benign and reassuring, rapid strep negative, throat culture pending.  Declines viral testing today.  Treat with viscous lidocaine, supportive over-the-counter medications and home care.  Return for acutely worsening symptoms. ? ?Final Clinical Impressions(s) / UC Diagnoses  ? ?Final diagnoses:  ?Sore throat  ? ?Discharge Instructions   ?None ?  ? ?ED Prescriptions   ? ? Medication Sig Dispense Auth. Provider  ? lidocaine (XYLOCAINE) 2 % solution Use as directed 10 mLs in the mouth or throat every 3 (three) hours as needed for mouth pain. 100 mL Volney American, [place]  ? ?  ? ?PDMP not reviewed this encounter. ?  ?Volney American, PA-C ?04/03/22 1420 ? ?

## 2022-04-03 NOTE — ED Triage Notes (Signed)
Pt states she started having a scratchy throat on Saturday ? ?Pt states she has a bad cough, headache and now she feels like her throat has ulcers ? ?Pt states she has tried tylenol without any relief ? ?Denies Fever ? ?Pt states Covid test on Tuesday and it was negative ?

## 2022-04-06 LAB — CULTURE, GROUP A STREP (THRC)

## 2022-07-21 DIAGNOSIS — Z681 Body mass index (BMI) 19 or less, adult: Secondary | ICD-10-CM | POA: Diagnosis not present

## 2022-07-21 DIAGNOSIS — R5383 Other fatigue: Secondary | ICD-10-CM | POA: Diagnosis not present

## 2022-07-21 DIAGNOSIS — Z01419 Encounter for gynecological examination (general) (routine) without abnormal findings: Secondary | ICD-10-CM | POA: Diagnosis not present

## 2022-07-21 DIAGNOSIS — R636 Underweight: Secondary | ICD-10-CM | POA: Diagnosis not present

## 2022-07-21 DIAGNOSIS — Z309 Encounter for contraceptive management, unspecified: Secondary | ICD-10-CM | POA: Diagnosis not present

## 2022-08-08 DIAGNOSIS — J020 Streptococcal pharyngitis: Secondary | ICD-10-CM | POA: Diagnosis not present

## 2022-08-08 DIAGNOSIS — Z20818 Contact with and (suspected) exposure to other bacterial communicable diseases: Secondary | ICD-10-CM | POA: Diagnosis not present

## 2022-10-21 DIAGNOSIS — N760 Acute vaginitis: Secondary | ICD-10-CM | POA: Diagnosis not present

## 2022-10-21 DIAGNOSIS — L292 Pruritus vulvae: Secondary | ICD-10-CM | POA: Diagnosis not present

## 2022-11-03 DIAGNOSIS — N760 Acute vaginitis: Secondary | ICD-10-CM | POA: Diagnosis not present

## 2022-11-03 DIAGNOSIS — L292 Pruritus vulvae: Secondary | ICD-10-CM | POA: Diagnosis not present

## 2023-01-21 ENCOUNTER — Ambulatory Visit (HOSPITAL_COMMUNITY)
Admission: RE | Admit: 2023-01-21 | Discharge: 2023-01-21 | Disposition: A | Discharge status: Home / Self Care | Payer: Commercial Managed Care - PPO | Source: Ambulatory Visit | Attending: Adult Health Nurse Practitioner | Admitting: Adult Health Nurse Practitioner

## 2023-01-21 ENCOUNTER — Other Ambulatory Visit (HOSPITAL_COMMUNITY): Payer: Self-pay | Admitting: Adult Health Nurse Practitioner

## 2023-01-21 DIAGNOSIS — R0789 Other chest pain: Secondary | ICD-10-CM | POA: Insufficient documentation

## 2023-01-21 DIAGNOSIS — K3184 Gastroparesis: Secondary | ICD-10-CM | POA: Diagnosis not present

## 2023-01-21 DIAGNOSIS — E1043 Type 1 diabetes mellitus with diabetic autonomic (poly)neuropathy: Secondary | ICD-10-CM | POA: Diagnosis not present

## 2023-01-21 DIAGNOSIS — R0602 Shortness of breath: Secondary | ICD-10-CM | POA: Diagnosis not present

## 2023-01-24 ENCOUNTER — Encounter: Payer: Self-pay | Admitting: Adult Health Nurse Practitioner

## 2023-08-03 DIAGNOSIS — R636 Underweight: Secondary | ICD-10-CM | POA: Diagnosis not present

## 2023-08-03 DIAGNOSIS — Z309 Encounter for contraceptive management, unspecified: Secondary | ICD-10-CM | POA: Diagnosis not present

## 2023-08-03 DIAGNOSIS — Z681 Body mass index (BMI) 19 or less, adult: Secondary | ICD-10-CM | POA: Diagnosis not present

## 2023-08-03 DIAGNOSIS — Z01419 Encounter for gynecological examination (general) (routine) without abnormal findings: Secondary | ICD-10-CM | POA: Diagnosis not present

## 2024-07-25 DIAGNOSIS — Z975 Presence of (intrauterine) contraceptive device: Secondary | ICD-10-CM | POA: Insufficient documentation

## 2024-08-03 DIAGNOSIS — Z681 Body mass index (BMI) 19 or less, adult: Secondary | ICD-10-CM | POA: Diagnosis not present

## 2024-08-03 DIAGNOSIS — R636 Underweight: Secondary | ICD-10-CM | POA: Diagnosis not present

## 2024-08-03 DIAGNOSIS — Z1151 Encounter for screening for human papillomavirus (HPV): Secondary | ICD-10-CM | POA: Diagnosis not present

## 2024-08-03 DIAGNOSIS — Z01419 Encounter for gynecological examination (general) (routine) without abnormal findings: Secondary | ICD-10-CM | POA: Diagnosis not present

## 2024-08-03 DIAGNOSIS — Z124 Encounter for screening for malignant neoplasm of cervix: Secondary | ICD-10-CM | POA: Diagnosis not present

## 2024-08-03 DIAGNOSIS — Z309 Encounter for contraceptive management, unspecified: Secondary | ICD-10-CM | POA: Diagnosis not present

## 2024-09-10 ENCOUNTER — Other Ambulatory Visit (HOSPITAL_COMMUNITY): Payer: Self-pay | Admitting: Adult Health Nurse Practitioner

## 2024-09-10 DIAGNOSIS — H539 Unspecified visual disturbance: Secondary | ICD-10-CM

## 2024-09-10 DIAGNOSIS — G43801 Other migraine, not intractable, with status migrainosus: Secondary | ICD-10-CM

## 2024-09-12 ENCOUNTER — Ambulatory Visit (HOSPITAL_COMMUNITY)
Admission: RE | Admit: 2024-09-12 | Discharge: 2024-09-12 | Disposition: A | Discharge status: Home / Self Care | Source: Ambulatory Visit | Attending: Adult Health Nurse Practitioner | Admitting: Adult Health Nurse Practitioner

## 2024-09-12 DIAGNOSIS — G43801 Other migraine, not intractable, with status migrainosus: Secondary | ICD-10-CM | POA: Insufficient documentation

## 2024-09-12 DIAGNOSIS — H539 Unspecified visual disturbance: Secondary | ICD-10-CM | POA: Insufficient documentation

## 2024-09-12 DIAGNOSIS — Q048 Other specified congenital malformations of brain: Secondary | ICD-10-CM | POA: Diagnosis not present

## 2024-10-01 DIAGNOSIS — M25562 Pain in left knee: Secondary | ICD-10-CM | POA: Diagnosis not present

## 2024-11-14 ENCOUNTER — Ambulatory Visit: Admitting: Neurology

## 2024-11-14 ENCOUNTER — Encounter: Payer: Self-pay | Admitting: Neurology

## 2024-11-14 VITALS — BP 107/72 | Ht 67.0 in | Wt 112.0 lb

## 2024-11-14 DIAGNOSIS — G43109 Migraine with aura, not intractable, without status migrainosus: Secondary | ICD-10-CM

## 2024-11-14 DIAGNOSIS — G43409 Hemiplegic migraine, not intractable, without status migrainosus: Secondary | ICD-10-CM

## 2024-11-14 DIAGNOSIS — G43809 Other migraine, not intractable, without status migrainosus: Secondary | ICD-10-CM | POA: Diagnosis not present

## 2024-11-14 MED ORDER — NURTEC 75 MG PO TBDP: 1.0000 | ORAL_TABLET | ORAL | 11 refills | Status: AC

## 2024-11-14 MED ORDER — RIZATRIPTAN BENZOATE 10 MG PO TBDP: 10.0000 mg | ORAL_TABLET | ORAL | 11 refills | Status: AC | PRN

## 2024-11-14 NOTE — Patient Instructions (Addendum)
 Use Nurtec as a preventative medication, every other day Will start Maxalt as abortive medication Please continue to keep a headache diary Follow-up in 6 months or sooner if worse

## 2024-11-14 NOTE — Progress Notes (Signed)
 [facility]  PATIENT: Maria Walton DOB: 03-29-1986  REQUESTING CLINICIAN: Suanne Pfeiffer, NP HISTORY FROM: Patient  REASON FOR VISIT: Migraines follow up    HISTORICAL  CHIEF COMPLAINT:  Chief Complaint  Patient presents with   RM12/MIGRAINES    Pt is here Alone. Pt states that she has had migraines for years. Pt states she lost vision in her left eye with her last headache.     HISTORY OF PRESENT ILLNESS:  This is a 38 year old woman with past history of migraines, who is presenting for management of her migraine.  She tells me that she has a history of migraine, and currently taking Nurtec as abortive medication.  She is not currently on any preventive medication.  She tells me that she has different types of migraine she does have migraine with aura and auras are described as flashes of light.  This is followed by severe headaches and sometimes associated with dizziness.  In the month of September she had a severe migraine and at that time her auras was described as a loss of vision on the left side.  Loss of vision lasted about 15 minutes and she had a severe headache following that. She also has a history of hemiplegic migraine and is migraine associated with left-sided numbness.  She tells me that she has never been on a preventative medication.  On average she will have 2 headache days per week.  With a headache she does have sensitivity to light and noise.  She does have a strong family history of migraine including her mother and her sisters.     OTHER MEDICAL CONDITIONS: Migraines    REVIEW OF SYSTEMS: Full 14 system review of systems performed and negative with exception of: As noted in the HPI   ALLERGIES: Allergies  Allergen Reactions   Ceclor [Cefaclor] Hives    Childhood allergy   Promethazine Hcl Other (See Comments)    hyperactivity   Latex Rash    Rash when using latex gloves    HOME MEDICATIONS: Outpatient Medications Prior to  Visit  Medication Sig Dispense Refill   acetaminophen  (TYLENOL ) 325 MG tablet Take 650 mg by mouth every 6 (six) hours as needed.     Cyanocobalamin  (VITAMIN B-12) 1000 MCG SUBL Place 1 tablet under the tongue at bedtime.     levocetirizine (XYZAL ) 5 MG tablet Take 1 tablet (5 mg total) by mouth every evening. 90 tablet 0   levonorgestrel  (MIRENA , 52 MG,) 20 MCG/DAY IUD Mirena  20 mcg/24 hours (7 yrs) 52 mg intrauterine device  Take 1 device by intrauterine route.     Multiple Vitamins-Minerals (ADULT ONE DAILY GUMMIES) CHEW Chew 2 tablets by mouth daily.     NURTEC 75 MG TBDP Take 1 tablet by mouth daily as needed.     benzonatate  (TESSALON ) 100 MG capsule Take 1 capsule by mouth every 8 (eight) hours for cough. (Patient not taking: Reported on 11/14/2024) 21 capsule 0   chlorpheniramine-HYDROcodone (TUSSIONEX PENNKINETIC ER) 10-8 MG/5ML SUER Take 5 mLs by mouth every 12 (twelve) hours as needed for cough. (Patient not taking: Reported on 11/14/2024) 60 mL 0   lidocaine  (XYLOCAINE ) 2 % solution Use as directed 10 mLs in the mouth or throat every 3 (three) hours as needed for mouth pain. (Patient not taking: Reported on 11/14/2024) 100 mL 0   olopatadine  (PATANOL) 0.1 % ophthalmic solution Place 1 drop into both eyes 2 (two) times daily as needed for allergies. (Patient not taking: Reported  on 11/14/2024) 5 mL 12   No facility-administered medications prior to visit.    PAST MEDICAL HISTORY: Past Medical History:  Diagnosis Date   Anemia    Only with this pregnancy; taking iron supplements   Complication of anesthesia    N/V; rash   Dizziness    Endometriosis    Headache    Heart palpitations     PAST SURGICAL HISTORY: Past Surgical History:  Procedure Laterality Date   APPENDECTOMY     dr mavis 1999   CHOLECYSTECTOMY  06/05/2012   Procedure: LAPAROSCOPIC CHOLECYSTECTOMY;  Surgeon: Elsie GORMAN Holland, MD;  Location: [facility];  Service: General;  Laterality: N/A;   PELVIC LAPAROSCOPY   2017   Endometriosis    FAMILY HISTORY: Family History  Problem Relation Age of Onset   Hypertension Father    Diabetes Maternal Grandmother    Heart disease Maternal Grandmother    Hypertension Paternal Grandfather    Breast cancer Paternal Grandmother 23    SOCIAL HISTORY: Social History   Socioeconomic History   Marital status: Married    Spouse name: Thresa   Number of children: Not on file   Years of education: Not on file   Highest education level: Not on file  Occupational History   Occupation: RN   Tobacco Use   Smoking status: Never    Passive exposure: Never   Smokeless tobacco: Never  Vaping Use   Vaping status: Never Used  Substance and Sexual Activity   Alcohol use: No    Alcohol/week: 0.0 standard drinks of alcohol   Drug use: No   Sexual activity: Yes    Birth control/protection: I.U.D.    Comment: intercourse age 33, sexual partners less than 5  Other Topics Concern   Not on file  Social History Narrative   Not on file   Social Drivers of Health   Financial Resource Strain: Low Risk  (09/10/2024)   Received from [facility]   Overall Financial Resource Strain (CARDIA)    How hard is it for you to pay for the very basics like food, housing, medical care, and heating?: Not hard at all  Food Insecurity: No Food Insecurity (09/10/2024)   Received from [facility]   Hunger Vital Sign    Within the past 12 months, you worried that your food would run out before you got the money to buy more.: Never true    Within the past 12 months, the food you bought just didn't last and you didn't have money to get more.: Never true  Transportation Needs: No Transportation Needs (09/10/2024)   Received from [facility] - Transportation    In the past 12 months, has lack of transportation kept you from medical appointments or from getting medications?: No    In the past 12 months, has lack of transportation kept you from meetings, work, or from getting  things needed for daily living?: No  Physical Activity: Unknown (01/19/2023)   Received from [facility]   Exercise Vital Sign    On average, how many days per week do you engage in moderate to strenuous exercise (like a brisk walk)?: 2 days    Minutes of Exercise per Session: Not on file  Stress: No Stress Concern Present (01/19/2023)   Received from [facility] - Occupational Stress Questionnaire    Feeling of Stress : Not at all  Social Connections: Socially Integrated (01/19/2023)   Received from  [facility]   Social Network    How would you rate your social network (family, work, friends)?: Good participation with social networks  Intimate Partner Violence: Not At Risk (01/19/2023)   Received from [facility]   HITS    Over the last 12 months how often did your partner physically hurt you?: Never    Over the last 12 months how often did your partner insult you or talk down to you?: Never    Over the last 12 months how often did your partner threaten you with physical harm?: Never    Over the last 12 months how often did your partner scream or curse at you?: Never    PHYSICAL EXAM  GENERAL EXAM/CONSTITUTIONAL: Vitals:  Vitals:   11/14/24 1054  BP: 107/72  Weight: 112 lb (50.8 kg)  Height: 5' 7 (1.702 m)   Body mass index is 17.54 kg/m. Wt Readings from Last 3 Encounters:  11/14/24 112 lb (50.8 kg)  01/09/20 106 lb (48.1 kg)  11/13/19 111 lb (50.3 kg)   Patient is in no distress; well developed, nourished and groomed; neck is supple  MUSCULOSKELETAL: Gait, strength, tone, movements noted in Neurologic exam below  NEUROLOGIC: MENTAL STATUS:      No data to display         awake, alert, oriented to person, place and time recent and remote memory intact normal attention and concentration language fluent, comprehension intact, naming intact fund of knowledge appropriate  CRANIAL NERVE:  2nd - no papilledema or  hemorrhages on fundoscopic exam 2nd, 3rd, 4th, 6th - pupils equal and reactive to light, visual fields full to confrontation, extraocular muscles intact, no nystagmus 5th - facial sensation symmetric 7th - facial strength symmetric 8th - hearing intact 9th - palate elevates symmetrically, uvula midline 11th - shoulder shrug symmetric 12th - tongue protrusion midline  MOTOR:  normal bulk and tone, full strength in the BUE, BLE  SENSORY:  normal and symmetric to light touch  COORDINATION:  finger-nose-finger, fine finger movements normal  GAIT/STATION:  normal   DIAGNOSTIC DATA (LABS, IMAGING, TESTING) - I reviewed patient records, labs, notes, testing and imaging myself where available.  Lab Results  Component Value Date   WBC 9.8 05/17/2019   HGB 12.1 05/17/2019   HCT 36.8 05/17/2019   MCV 89.5 05/17/2019   PLT 234 05/17/2019      Component Value Date/Time   NA 134 (L) 12/12/2018 1722   K 3.4 (L) 12/12/2018 1722   CL 102 12/12/2018 1722   CO2 22 12/12/2018 1722   GLUCOSE 82 12/12/2018 1722   BUN 8 12/12/2018 1722   CREATININE 0.45 12/12/2018 1722   CREATININE 0.56 03/12/2016 1545   CALCIUM  8.2 (L) 12/12/2018 1722   PROT 7.3 12/12/2018 1722   ALBUMIN 3.5 12/12/2018 1722   AST 16 12/12/2018 1722   ALT 12 12/12/2018 1722   ALKPHOS 44 12/12/2018 1722   BILITOT 1.6 (H) 12/12/2018 1722   GFRNONAA >60 12/12/2018 1722   GFRAA >60 12/12/2018 1722   Lab Results  Component Value Date   CHOL 131 03/12/2016   HDL 39 (L) 03/12/2016   LDLCALC 84 03/12/2016   TRIG 41 03/12/2016   CHOLHDL 3.4 03/12/2016   No results found for: HGBA1C Lab Results  Component Value Date   VITAMINB12 263 02/01/2019   Lab Results  Component Value Date   TSH 1.705 12/30/2014    MRI Brain 09/12/2024 1. No evidence of an acute intracranial abnormality. 2. Mild  cerebellar tonsillar ectopia. 3. Otherwise unremarkable non-contrast MRI appearance of the brain   ASSESSMENT AND  PLAN  38 y.o. year old female with history of migraine who is presenting for management of her migraines.  She does have an average 2 headache days per week, currently only on Nurtec as needed for headache.  Plan will be to use the Nurtec as a preventative medication, patient understand to take it every other day and we will give her Maxalt as abortive medication.  She will keep a headache diary and I will see her in 6 months for follow-up.  She understands to contact us  sooner if her headaches do get worse.   1. Migraine with aura and without status migrainosus, not intractable   2. Hemiplegic migraine without status migrainosus, not intractable   3. Vestibular migraine     Patient Instructions  Use Nurtec as a preventative medication, every other day Will start Maxalt as abortive medication Please continue to keep a headache diary Follow-up in 6 months or sooner if worse   No orders of the defined types were placed in this encounter.   Meds ordered this encounter  Medications   NURTEC 75 MG TBDP    Sig: Take 1 tablet (75 mg total) by mouth every other day.    Dispense:  16 tablet    Refill:  11    Will use it as Preventive   rizatriptan (MAXALT-MLT) 10 MG disintegrating tablet    Sig: Take 1 tablet (10 mg total) by mouth as needed for migraine. May repeat in 2 hours if needed    Dispense:  9 tablet    Refill:  11    Return in about 6 months (around 05/15/2025).    Pastor Falling, MD 11/14/2024, 4:53 PM  [facility] [address], [address] [place], [place] [address] [phone]

## 2025-01-30 ENCOUNTER — Ambulatory Visit: Admitting: Neurology

## 2025-06-20 ENCOUNTER — Ambulatory Visit: Admitting: Neurology
